# Patient Record
Sex: Female | Born: 1960 | Race: Black or African American | Hispanic: No | Marital: Married | Smoking: Never smoker
Health system: Southern US, Community
[De-identification: ages and names within clinical notes are randomized; demographics above are authoritative.]

## PROBLEM LIST (undated history)

## (undated) DIAGNOSIS — J45909 Unspecified asthma, uncomplicated: Secondary | ICD-10-CM

## (undated) DIAGNOSIS — E785 Hyperlipidemia, unspecified: Secondary | ICD-10-CM

## (undated) DIAGNOSIS — K219 Gastro-esophageal reflux disease without esophagitis: Secondary | ICD-10-CM

## (undated) DIAGNOSIS — I10 Essential (primary) hypertension: Secondary | ICD-10-CM

## (undated) DIAGNOSIS — M1990 Unspecified osteoarthritis, unspecified site: Secondary | ICD-10-CM

## (undated) HISTORY — DX: Unspecified osteoarthritis, unspecified site: M19.90

## (undated) HISTORY — PX: APPENDECTOMY: SHX54

## (undated) HISTORY — DX: Gastro-esophageal reflux disease without esophagitis: K21.9

## (undated) HISTORY — DX: Unspecified asthma, uncomplicated: J45.909

## (undated) HISTORY — DX: Hyperlipidemia, unspecified: E78.5

---

## 2004-10-21 ENCOUNTER — Inpatient Hospital Stay: Payer: Self-pay | Admitting: Internal Medicine

## 2004-10-26 ENCOUNTER — Ambulatory Visit: Payer: Self-pay

## 2009-05-12 ENCOUNTER — Ambulatory Visit: Payer: Self-pay | Admitting: Otolaryngology

## 2011-09-29 ENCOUNTER — Ambulatory Visit: Payer: Self-pay | Admitting: Internal Medicine

## 2011-11-15 ENCOUNTER — Emergency Department: Payer: Self-pay | Admitting: Emergency Medicine

## 2011-11-16 ENCOUNTER — Ambulatory Visit: Payer: Self-pay

## 2012-09-12 ENCOUNTER — Ambulatory Visit: Payer: Self-pay | Admitting: Family Medicine

## 2014-02-25 IMAGING — US US EXTREM LOW VENOUS*L*
2 series · 14 of 24 positions shown · non-contrast
Comparison: none

REASON FOR EXAM: CR [id number] Left Lower Extr Pain Swelling Eval DVT
COMMENTS:

[Series 1: us extrem low venous*left* · 0.09mm/px · 13 of 43 slices shown (1 of 2)]
[im 1/43]
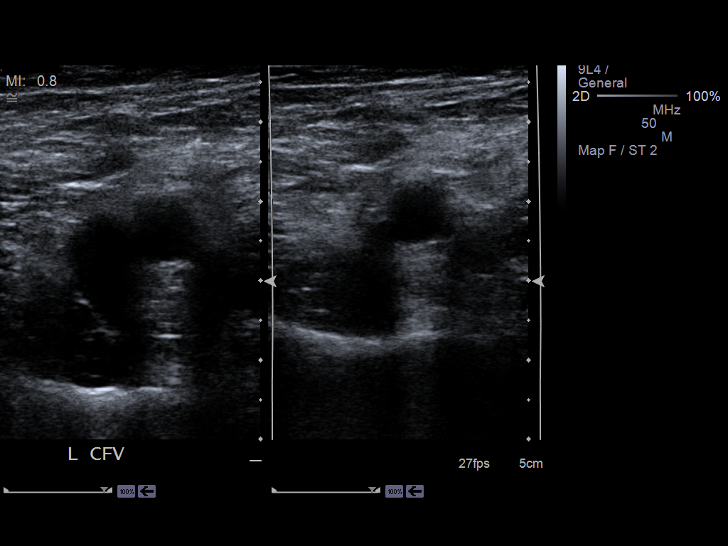
[im 5/43]
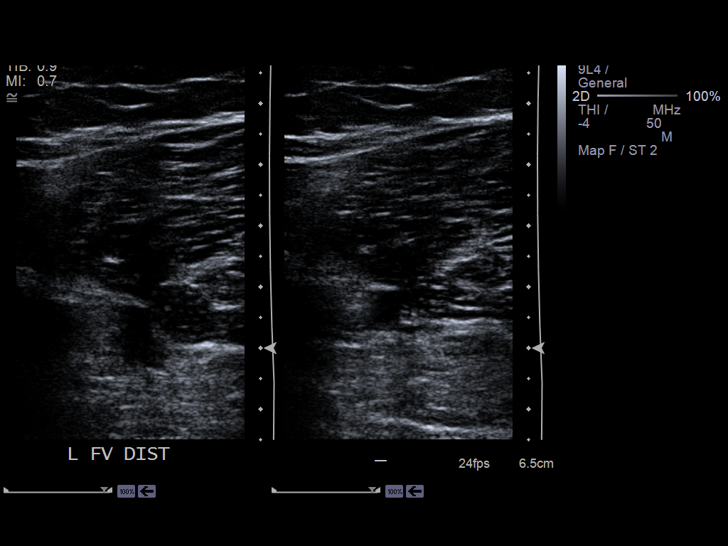
[im 9/43]
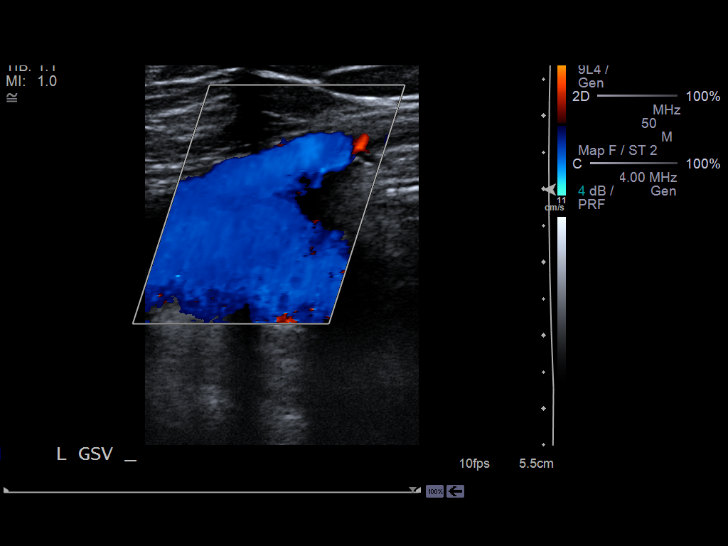
[im 13/43]
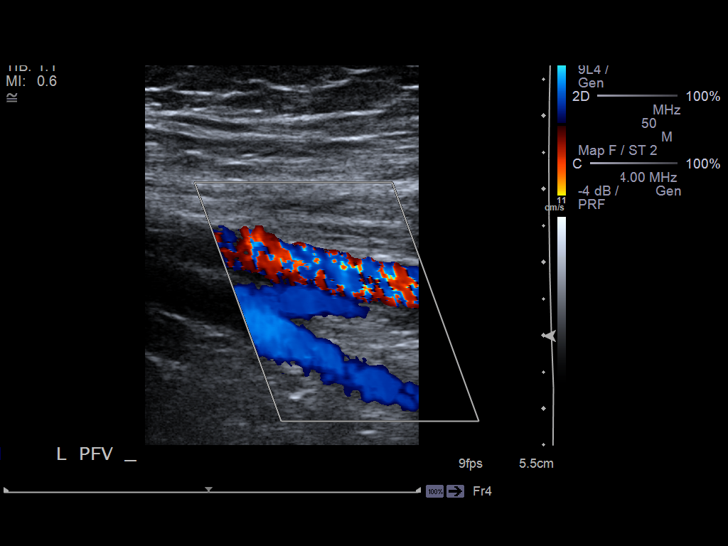
[im 15/43]
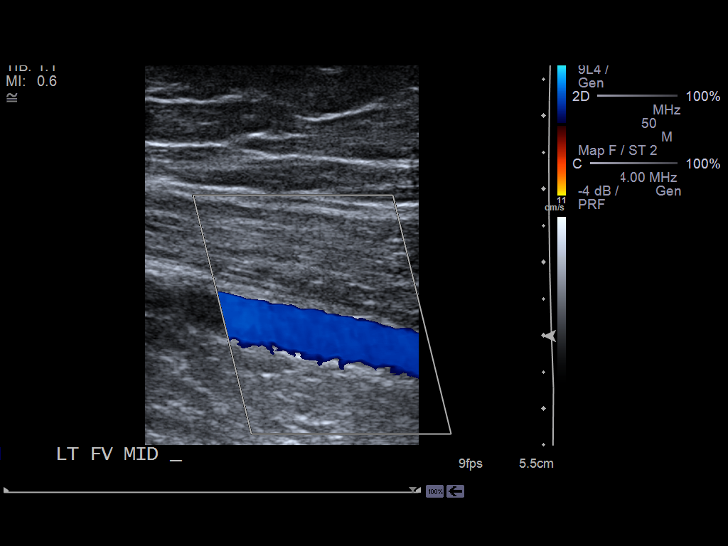
[im 19/43]
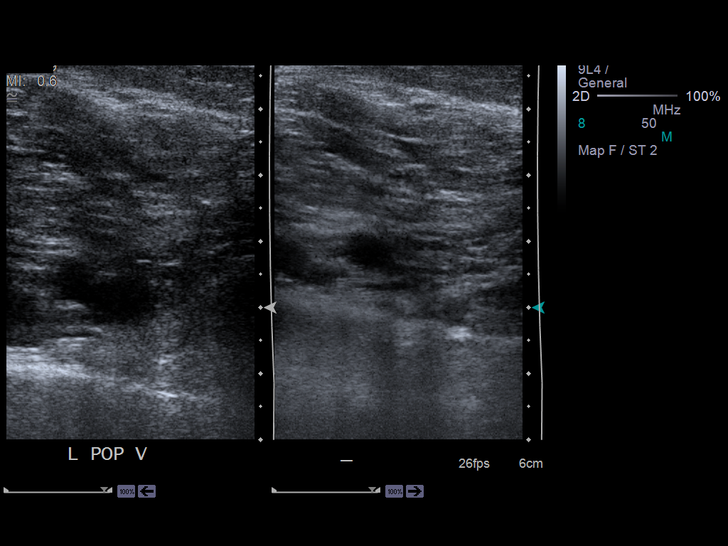
[im 23/43]
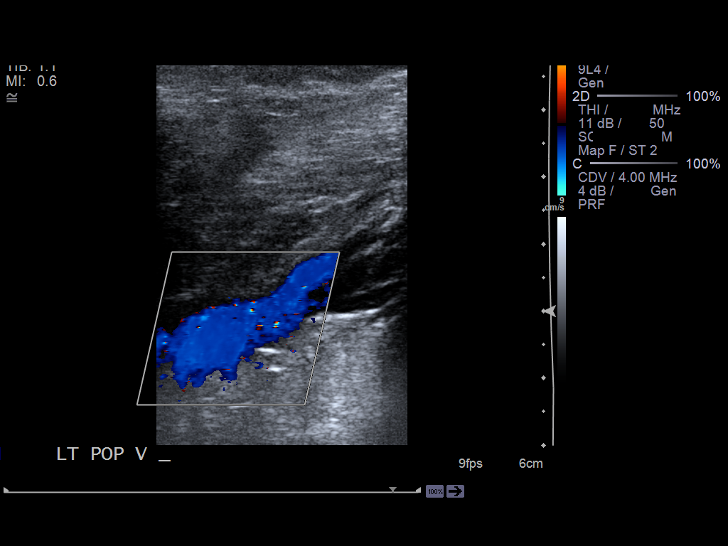
[im 25/43]
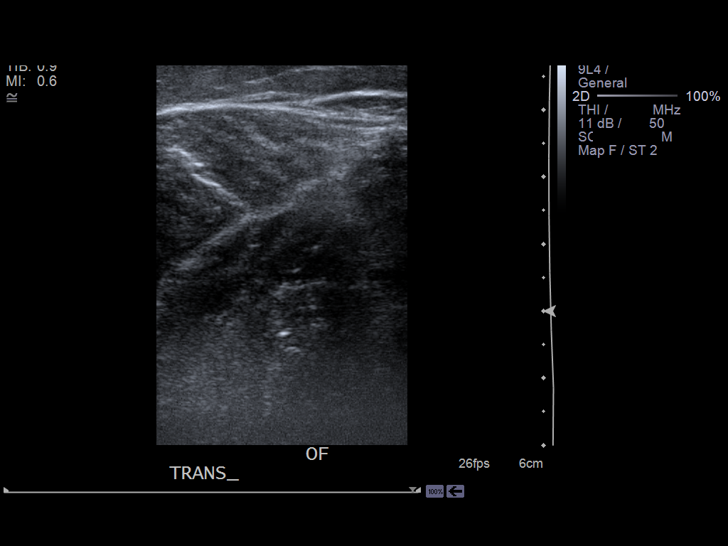
[im 29/43]
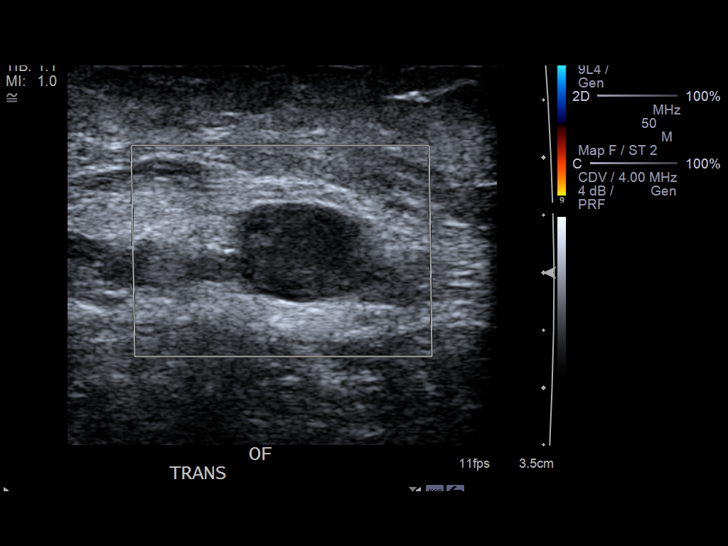
[im 33/43]
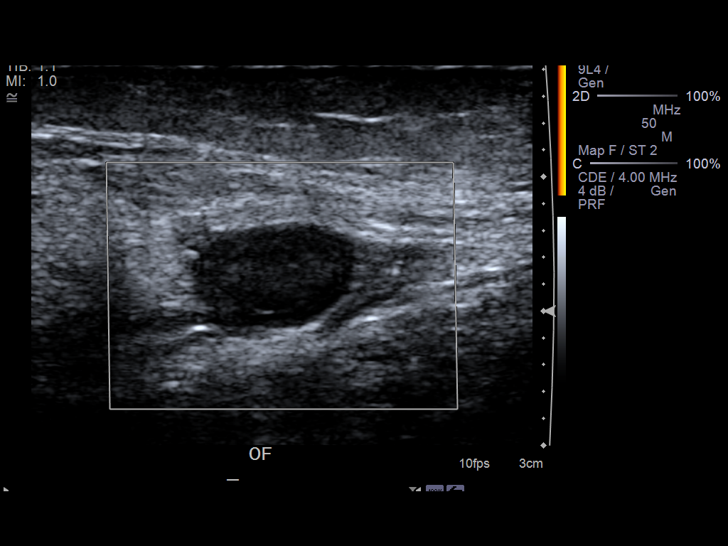
[im 37/43]
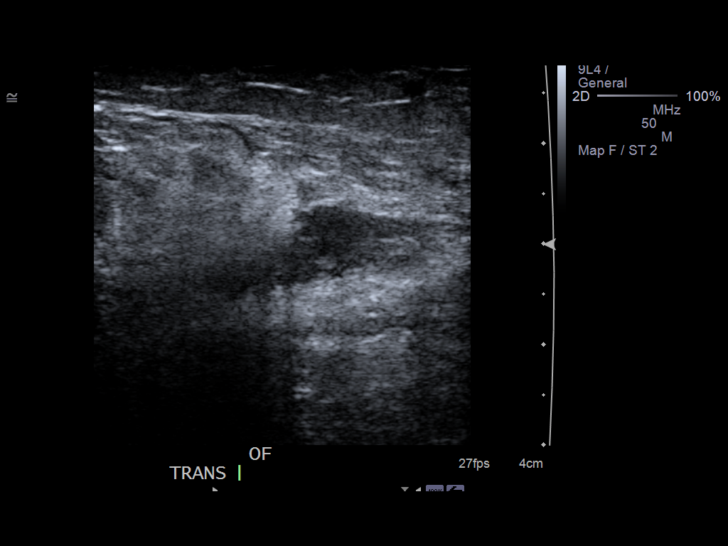
[im 39/43]
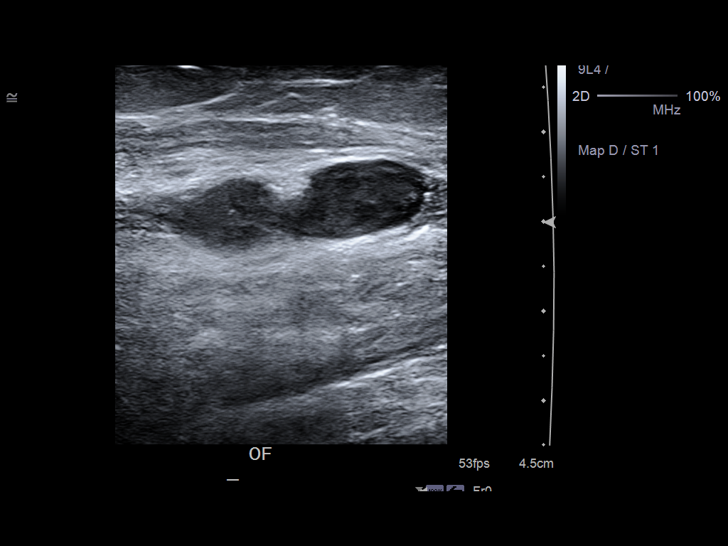
[im 43/43]
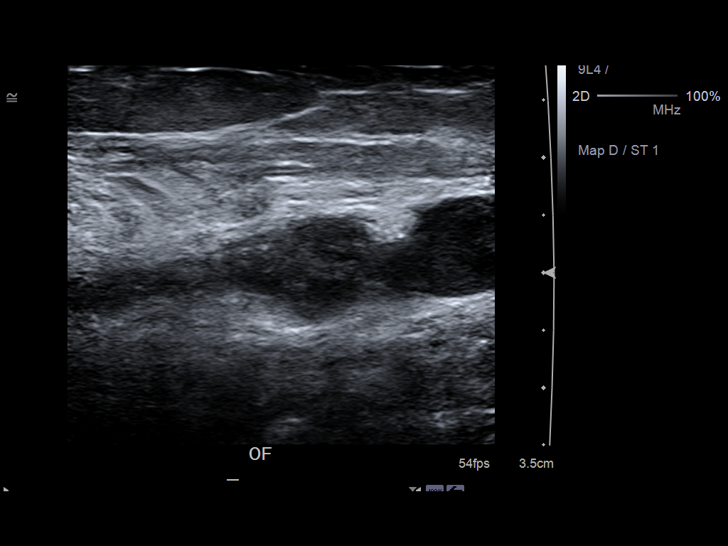

[Series 2: us extrem low venous*left* · 0.14mm/px · 1 of 3 slices shown (2 of 2)]
[im 3/3]
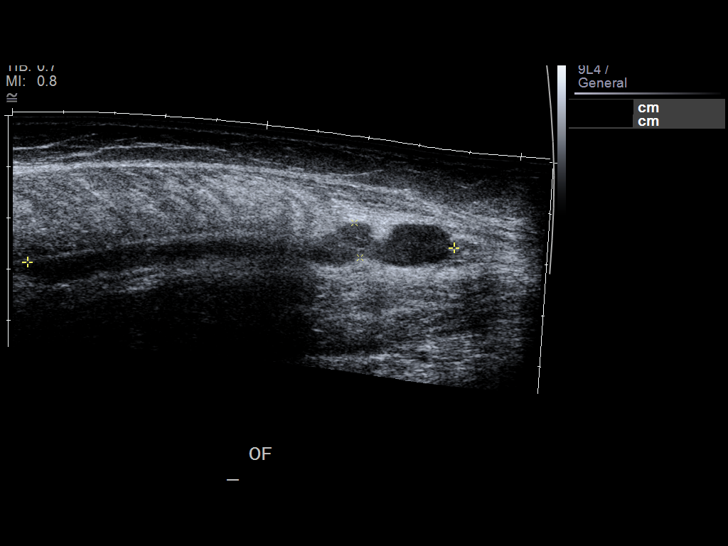

[14 of 24 positions shown; findings below may reference images not displayed]

PROCEDURE:     US  - US DOPPLER LOW EXTR LEFT  - November 16, 2011  [DATE]

RESULT:     Left lower extremity color flow duplex Doppler reveals no
evidence of deep venous thrombosis. A rounded soft tissue density is noted
in the region of the calf. This has internal echoes. Thrombosed varicosity,
solid lesion is a 2, and complex Baker's cyst could present in this fashion.
MRI of the calf may prove useful for further evaluation.
IMPRESSION: Complex rounded solid density measuring 3 cm noted in the
proximal calf region. See differential diagnosis above. MRI should be
considered for further evaluation.

## 2014-12-23 IMAGING — MG MAM DGTL SCRN MAM NO ORDER W/CAD
1 series · 4 of 4 positions shown · non-contrast
Comparison: none

REASON FOR EXAM: SCR MAMMO NO ORDER
COMMENTS:

[R CC · right · 4 of 4 slices shown]
[im 1/4]
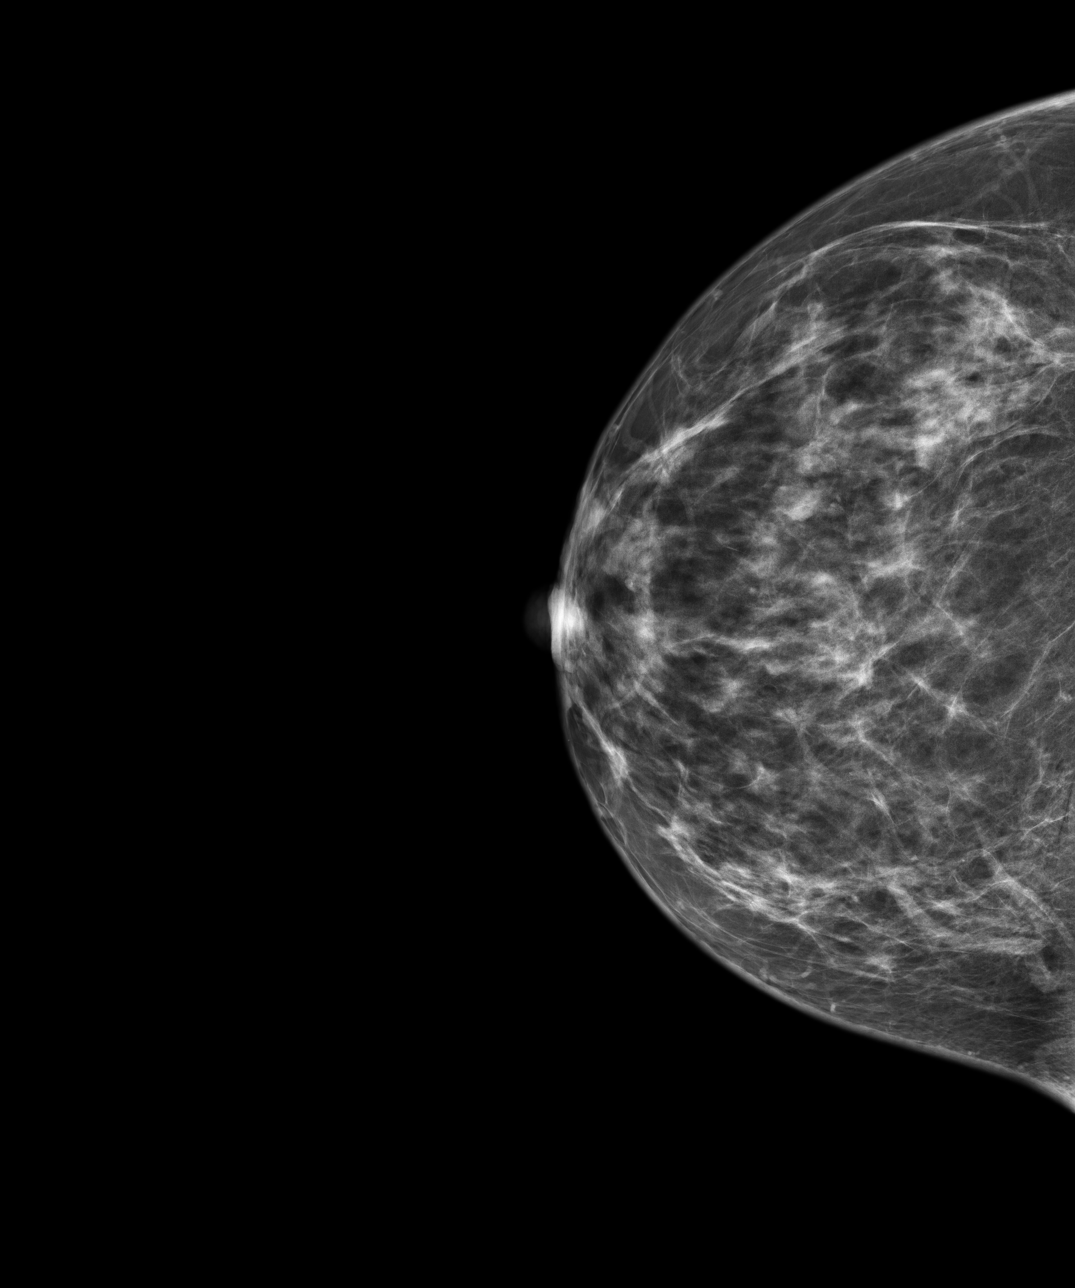
[im 2/4]
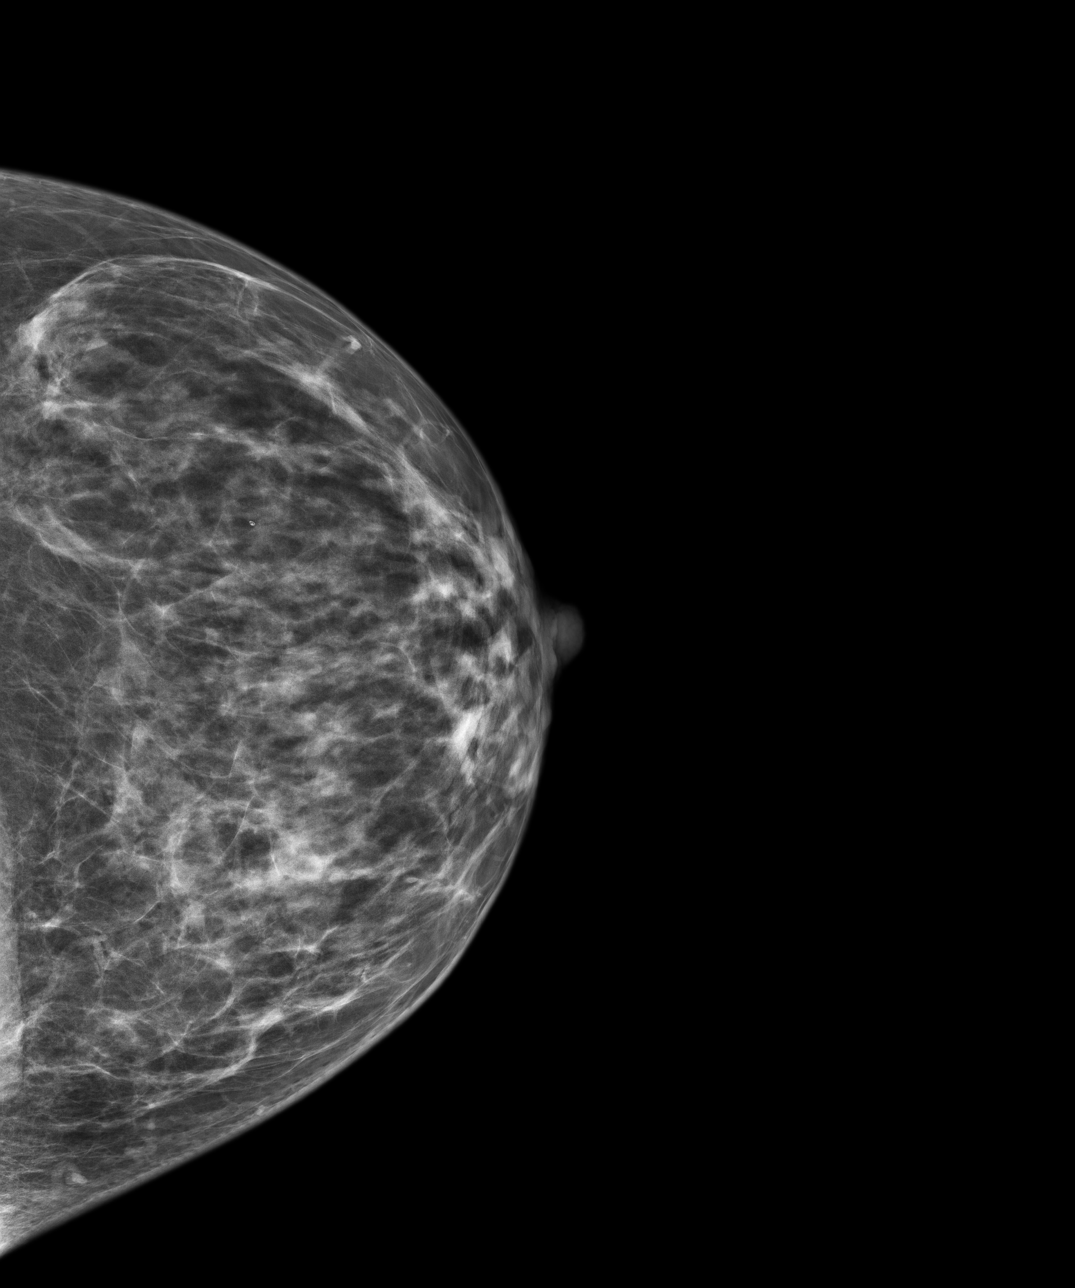
[im 3/4]
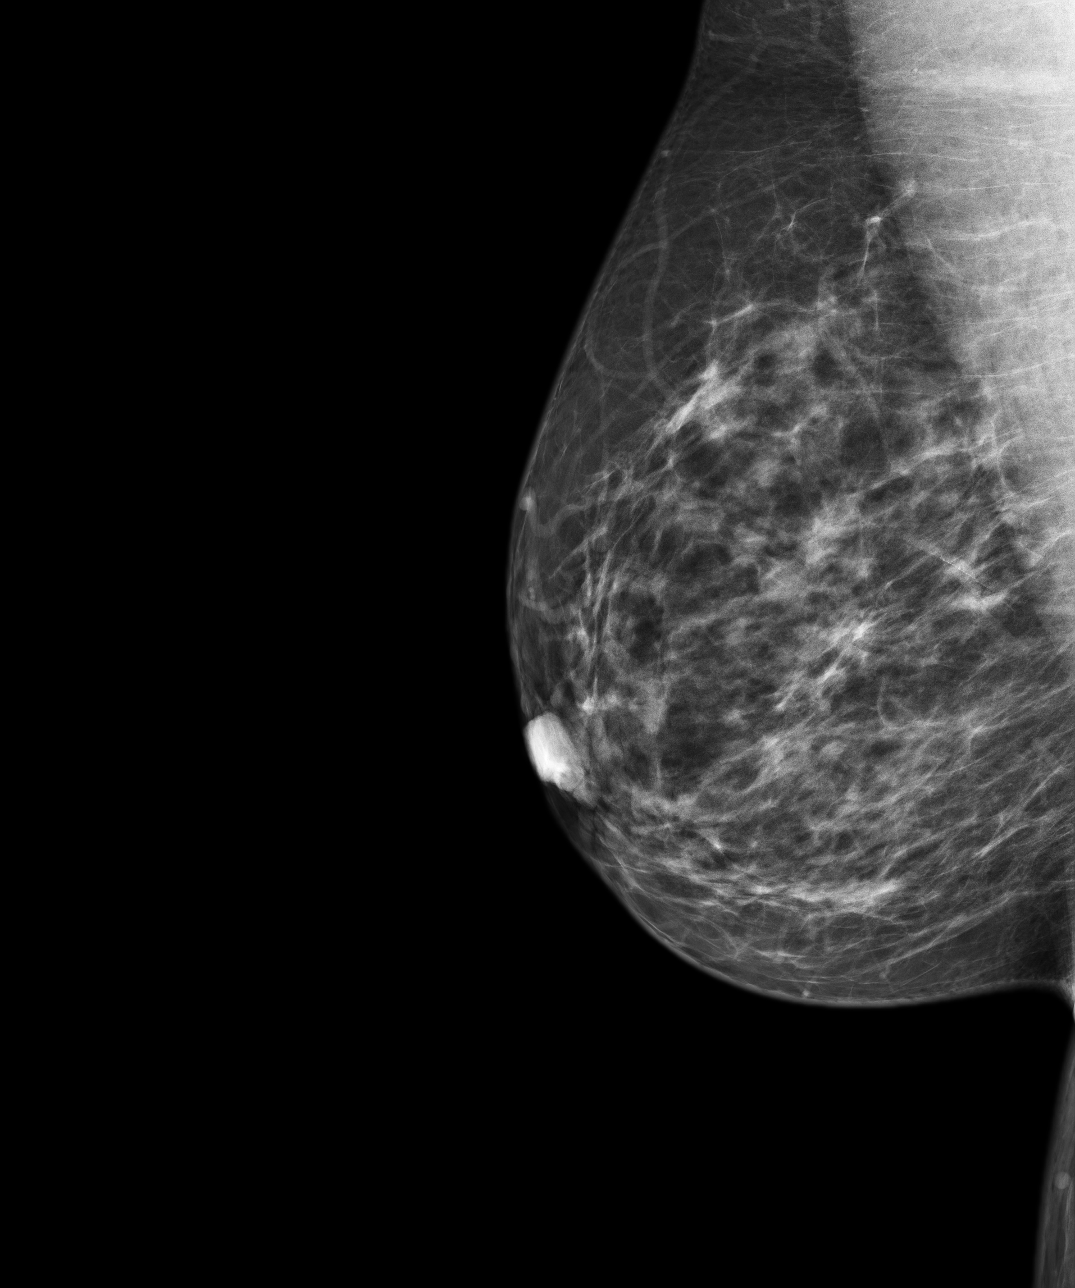
[im 4/4]
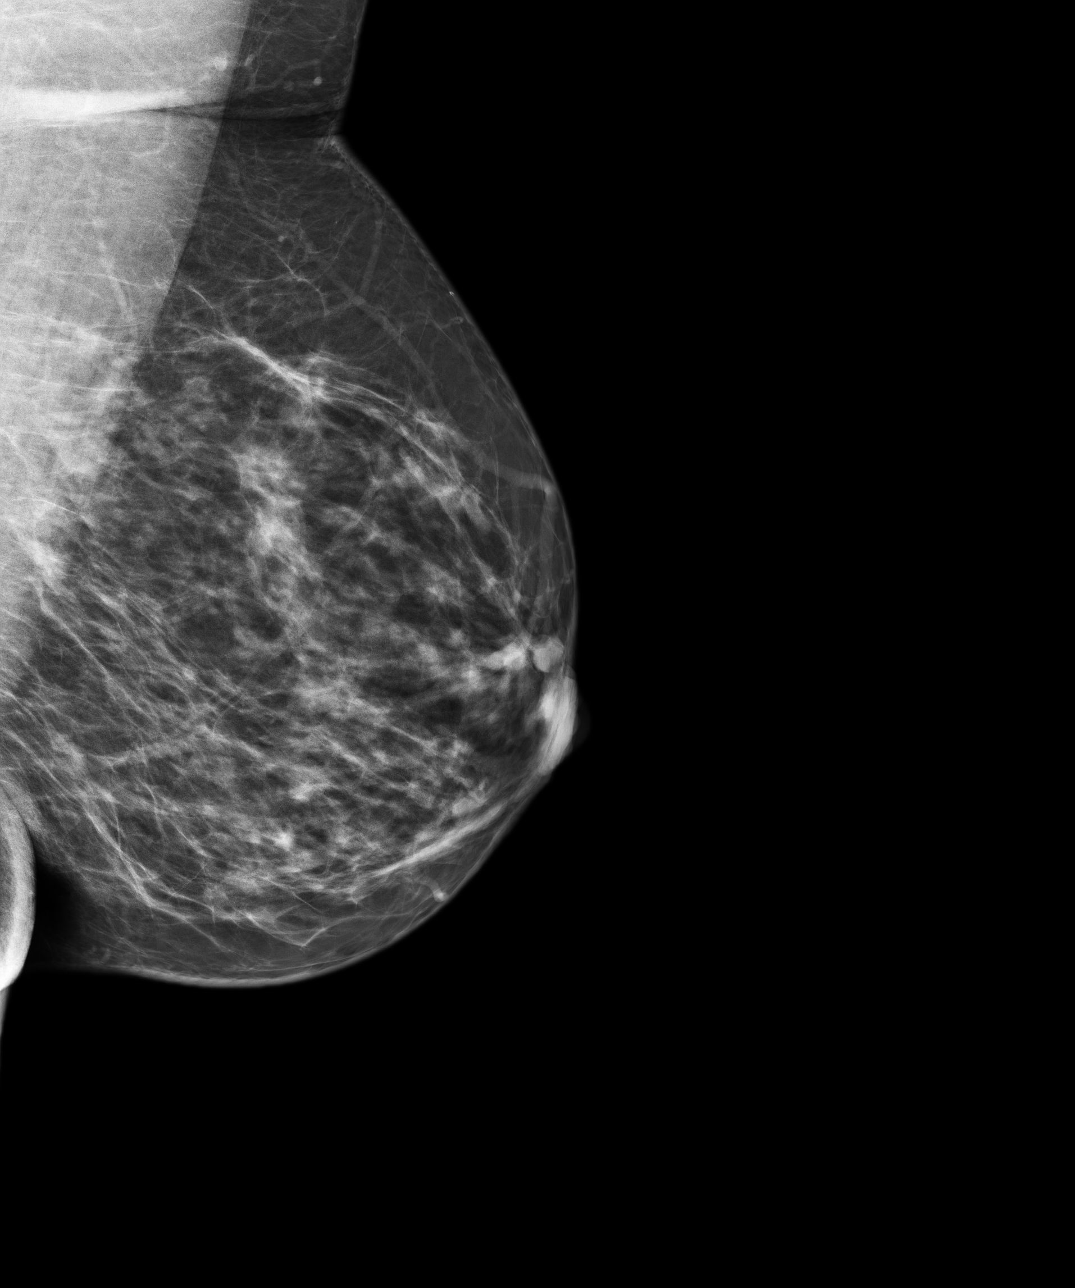

[4 of 4 positions shown; findings below may reference images not displayed]

PROCEDURE:     MAM - MAM DGTL SCRN MAM NO ORDER W/CAD  - September 12, 2012  [DATE]

RESULT:     The patient underwent bilateral screening mammography today.
This study is interpreted as a baseline study given that the previous
studies from 4922 are not available.

The breasts exhibit a scattered fibroglandular pattern. There is no dominant
mass. There are no malignant appearing groupings of microcalcification. No
areas of new architectural distortion are demonstrated.
IMPRESSION: There are no findings suspicious or malignancy.

BI-RADS 1: Negative mammogram.

Recommendation: please began yearly mammographic followup.

BREAST COMPOSITION: The breast composition is SCATTERED FIBROGLANDULAR
TISSUE (glandular tissue is 25-50%)

A NEGATIVE MAMMOGRAM REPORT DOES NOT PRECLUDE BIOPSY OR OTHER EVALUATION OF
A CLINICALLY PALPABLE OR OTHERWISE SUSPICIOUS MASS OR LESION. BREAST CANCER
MAY NOT BE DETECTED BY MAMMOGRAPHY IN UP TO 10% OF CASES.

Dictation site:1

## 2015-02-18 ENCOUNTER — Ambulatory Visit: Payer: Self-pay

## 2015-02-18 ENCOUNTER — Encounter: Payer: Self-pay | Admitting: Podiatry

## 2015-02-18 ENCOUNTER — Ambulatory Visit (INDEPENDENT_AMBULATORY_CARE_PROVIDER_SITE_OTHER): Payer: Managed Care, Other (non HMO) | Admitting: Podiatry

## 2015-02-18 VITALS — BP 154/95 | HR 86 | Resp 16 | Ht 63.0 in | Wt 155.0 lb

## 2015-02-18 DIAGNOSIS — M79673 Pain in unspecified foot: Secondary | ICD-10-CM

## 2015-02-18 DIAGNOSIS — M722 Plantar fascial fibromatosis: Secondary | ICD-10-CM | POA: Diagnosis not present

## 2015-02-18 MED ORDER — DICLOFENAC SODIUM 1 % TD GEL: 2.0000 g | Freq: Four times a day (QID) | TRANSDERMAL | Status: DC

## 2015-02-18 NOTE — Patient Instructions (Signed)
Plantar Fasciitis (Heel Spur Syndrome) with Rehab The plantar fascia is a fibrous, ligament-like, soft-tissue structure that spans the bottom of the foot. Plantar fasciitis is a condition that causes pain in the foot due to inflammation of the tissue. SYMPTOMS   Pain and tenderness on the underneath side of the foot.  Pain that worsens with standing or walking. CAUSES  Plantar fasciitis is caused by irritation and injury to the plantar fascia on the underneath side of the foot. Common mechanisms of injury include:  Direct trauma to bottom of the foot.  Damage to a small nerve that runs under the foot where the main fascia attaches to the heel bone.  Stress placed on the plantar fascia due to bone spurs. RISK INCREASES WITH:   Activities that place stress on the plantar fascia (running, jumping, pivoting, or cutting).  Poor strength and flexibility.  Improperly fitted shoes.  Tight calf muscles.  Flat feet.  Failure to warm-up properly before activity.  Obesity. PREVENTION  Warm up and stretch properly before activity.  Allow for adequate recovery between workouts.  Maintain physical fitness:  Strength, flexibility, and endurance.  Cardiovascular fitness.  Maintain a health body weight.  Avoid stress on the plantar fascia.  Wear properly fitted shoes, including arch supports for individuals who have flat feet. PROGNOSIS  If treated properly, then the symptoms of plantar fasciitis usually resolve without surgery. However, occasionally surgery is necessary. RELATED COMPLICATIONS   Recurrent symptoms that may result in a chronic condition.  Problems of the lower back that are caused by compensating for the injury, such as limping.  Pain or weakness of the foot during push-off following surgery.  Chronic inflammation, scarring, and partial or complete fascia tear, occurring more often from repeated injections. TREATMENT  Treatment initially involves the use of  ice and medication to help reduce pain and inflammation. The use of strengthening and stretching exercises may help reduce pain with activity, especially stretches of the Achilles tendon. These exercises may be performed at home or with a therapist. Your caregiver may recommend that you use heel cups of arch supports to help reduce stress on the plantar fascia. Occasionally, corticosteroid injections are given to reduce inflammation. If symptoms persist for greater than 6 months despite non-surgical (conservative), then surgery may be recommended.  MEDICATION   If pain medication is necessary, then nonsteroidal anti-inflammatory medications, such as aspirin and ibuprofen, or other minor pain relievers, such as acetaminophen, are often recommended.  Do not take pain medication within 7 days before surgery.  Prescription pain relievers may be given if deemed necessary by your caregiver. Use only as directed and only as much as you need.  Corticosteroid injections may be given by your caregiver. These injections should be reserved for the most serious cases, because they may only be given a certain number of times. HEAT AND COLD  Cold treatment (icing) relieves pain and reduces inflammation. Cold treatment should be applied for 10 to 15 minutes every 2 to 3 hours for inflammation and pain and immediately after any activity that aggravates your symptoms. Use ice packs or massage the area with a piece of ice (ice massage).  Heat treatment may be used prior to performing the stretching and strengthening activities prescribed by your caregiver, physical therapist, or athletic trainer. Use a heat pack or soak the injury in warm water. SEEK IMMEDIATE MEDICAL CARE IF:  Treatment seems to offer no benefit, or the condition worsens.  Any medications produce adverse side effects. EXERCISES RANGE   OF MOTION (ROM) AND STRETCHING EXERCISES - Plantar Fasciitis (Heel Spur Syndrome) These exercises may help you  when beginning to rehabilitate your injury. Your symptoms may resolve with or without further involvement from your physician, physical therapist or athletic trainer. While completing these exercises, remember:   Restoring tissue flexibility helps normal motion to return to the joints. This allows healthier, less painful movement and activity.  An effective stretch should be held for at least 30 seconds.  A stretch should never be painful. You should only feel a gentle lengthening or release in the stretched tissue. RANGE OF MOTION - Toe Extension, Flexion  Sit with your right / left leg crossed over your opposite knee.  Grasp your toes and gently pull them back toward the top of your foot. You should feel a stretch on the bottom of your toes and/or foot.  Hold this stretch for __________ seconds.  Now, gently pull your toes toward the bottom of your foot. You should feel a stretch on the top of your toes and or foot.  Hold this stretch for __________ seconds. Repeat __________ times. Complete this stretch __________ times per day.  RANGE OF MOTION - Ankle Dorsiflexion, Active Assisted  Remove shoes and sit on a chair that is preferably not on a carpeted surface.  Place right / left foot under knee. Extend your opposite leg for support.  Keeping your heel down, slide your right / left foot back toward the chair until you feel a stretch at your ankle or calf. If you do not feel a stretch, slide your bottom forward to the edge of the chair, while still keeping your heel down.  Hold this stretch for __________ seconds. Repeat __________ times. Complete this stretch __________ times per day.  STRETCH - Gastroc, Standing  Place hands on wall.  Extend right / left leg, keeping the front knee somewhat bent.  Slightly point your toes inward on your back foot.  Keeping your right / left heel on the floor and your knee straight, shift your weight toward the wall, not allowing your back to  arch.  You should feel a gentle stretch in the right / left calf. Hold this position for __________ seconds. Repeat __________ times. Complete this stretch __________ times per day. STRETCH - Soleus, Standing  Place hands on wall.  Extend right / left leg, keeping the other knee somewhat bent.  Slightly point your toes inward on your back foot.  Keep your right / left heel on the floor, bend your back knee, and slightly shift your weight over the back leg so that you feel a gentle stretch deep in your back calf.  Hold this position for __________ seconds. Repeat __________ times. Complete this stretch __________ times per day. STRETCH - Gastrocsoleus, Standing  Note: This exercise can place a lot of stress on your foot and ankle. Please complete this exercise only if specifically instructed by your caregiver.   Place the ball of your right / left foot on a step, keeping your other foot firmly on the same step.  Hold on to the wall or a rail for balance.  Slowly lift your other foot, allowing your body weight to press your heel down over the edge of the step.  You should feel a stretch in your right / left calf.  Hold this position for __________ seconds.  Repeat this exercise with a slight bend in your right / left knee. Repeat __________ times. Complete this stretch __________ times per day.    STRENGTHENING EXERCISES - Plantar Fasciitis (Heel Spur Syndrome)  These exercises may help you when beginning to rehabilitate your injury. They may resolve your symptoms with or without further involvement from your physician, physical therapist or athletic trainer. While completing these exercises, remember:   Muscles can gain both the endurance and the strength needed for everyday activities through controlled exercises.  Complete these exercises as instructed by your physician, physical therapist or athletic trainer. Progress the resistance and repetitions only as guided. STRENGTH -  Towel Curls  Sit in a chair positioned on a non-carpeted surface.  Place your foot on a towel, keeping your heel on the floor.  Pull the towel toward your heel by only curling your toes. Keep your heel on the floor.  If instructed by your physician, physical therapist or athletic trainer, add ____________________ at the end of the towel. Repeat __________ times. Complete this exercise __________ times per day. STRENGTH - Ankle Inversion  Secure one end of a rubber exercise band/tubing to a fixed object (table, pole). Loop the other end around your foot just before your toes.  Place your fists between your knees. This will focus your strengthening at your ankle.  Slowly, pull your big toe up and in, making sure the band/tubing is positioned to resist the entire motion.  Hold this position for __________ seconds.  Have your muscles resist the band/tubing as it slowly pulls your foot back to the starting position. Repeat __________ times. Complete this exercises __________ times per day.  Document Released: 05/10/2005 Document Revised: 08/02/2011 Document Reviewed: 08/22/2008 [employer] Patient Information 2015 [employer]. This information is not intended to replace advice given to you by your health care provider. Make sure you discuss any questions you have with your health care provider.  

## 2015-02-18 NOTE — Progress Notes (Signed)
   Subjective:    Patient ID: Sydney Martinez, female    DOB: [birth year], 54 y.o.   MRN: [id number]  HPI 54 year old female presents the office with commands of bilateral foot pain of the right side worse than the left which has been ongoing for approximately 3 months. She states that she can feel small and not forming in the bottom of her right foot on the ball of her foot. She has pain in this area with weightbearing. She also states that she has pain in the arch her foot on the left side which is been going on for the same time if not longer than the right foot. She's been taking ibuprofen which does not seem to help. She describes as a throbbing pain. Standing or weightbearing makes the area worse. Denies any recent injury or trauma. No swelling or redness. No tingling or numbness. No other complaints at this time.   Review of Systems  All other systems reviewed and are negative.      Objective:   Physical Exam AAO x3, NAD DP/PT pulses palpable bilaterally, CRT less than 3 seconds Protective sensation intact with Simms Weinstein monofilament, vibratory sensation intact, Achilles tendon reflex intact There is tenderness to palpation along the medial band of the plantar fascia distally proximal to the sesamoid complex. There does appear to be a small firm nodule within the ligament. There is tenderness to palpation over the area. There is no overlying edema, erythema, increase in warmth. There is no area pinpoint bony tenderness or pain the vibratory sensation. On the left foot there is tenderness on the medial arch of the foot plantarly. As appears a more generalized pain. There is no specific area of tenderness to palpation. Again there is no overlying edema, erythema, increase in warmth. There is a mild decrease in medial arch height upon weightbearing bilaterally. Equinus is present. No other areas of tenderness to bilateral lower extremities. MMT 5/5, ROM WNL.  No open lesions or  pre-ulcerative lesions.  No pain with calf compression, swelling, warmth, erythema bilaterally.      Assessment & Plan:  54 year old female with bilateral foot pain with a right greater than left. -Treatment options discussed including all alternatives, risks, and complications -X-rays were obtained and reviewed with the patient.  -Discussed that the pain in the right foot a be a small plantar fibroma. I discussed with her steroid injection however she wishes to hold off on that. Offloading pads were dispensed. Prescribed full tear and gel. Discussed shoe gear modifications and orthotics. Anti-inflammatories as needed. -On the left side she'll likely benefit from the arch support. Discussed that both custom and over-the-counter. She will look at an over-the-counter pair of orthotics. Continue supportive shoe gear. -Follow-up in 4 weeks or sooner if any problems arise. In the meantime, encouraged to call the office with any questions, concerns, change in symptoms.   Ovid Curd, DPM

## 2015-02-19 DIAGNOSIS — M79673 Pain in unspecified foot: Secondary | ICD-10-CM

## 2015-03-20 ENCOUNTER — Ambulatory Visit: Payer: Managed Care, Other (non HMO) | Admitting: Podiatry

## 2016-11-09 DIAGNOSIS — M6530 Trigger finger, unspecified finger: Secondary | ICD-10-CM | POA: Insufficient documentation

## 2017-05-17 ENCOUNTER — Encounter: Payer: Self-pay | Admitting: Emergency Medicine

## 2017-05-17 ENCOUNTER — Emergency Department
Admission: EM | Admit: 2017-05-17 | Discharge: 2017-05-17 | Disposition: A | Discharge status: Home / Self Care | Payer: Self-pay | Attending: Emergency Medicine | Admitting: Emergency Medicine

## 2017-05-17 DIAGNOSIS — J069 Acute upper respiratory infection, unspecified: Secondary | ICD-10-CM | POA: Insufficient documentation

## 2017-05-17 DIAGNOSIS — I10 Essential (primary) hypertension: Secondary | ICD-10-CM | POA: Insufficient documentation

## 2017-05-17 DIAGNOSIS — Z79899 Other long term (current) drug therapy: Secondary | ICD-10-CM | POA: Insufficient documentation

## 2017-05-17 HISTORY — DX: Essential (primary) hypertension: I10

## 2017-05-17 MED ORDER — AZITHROMYCIN 500 MG PO TABS: 500.0000 mg | ORAL_TABLET | Freq: Every day | ORAL | 0 refills | Status: DC

## 2017-05-17 MED ORDER — BENZONATATE 100 MG PO CAPS: 200.0000 mg | ORAL_CAPSULE | Freq: Once | ORAL | Status: DC

## 2017-05-17 MED ORDER — AZITHROMYCIN 500 MG PO TABS: 500.0000 mg | ORAL_TABLET | Freq: Once | ORAL | Status: DC

## 2017-05-17 MED ORDER — AZITHROMYCIN 500 MG PO TABS
ORAL_TABLET | ORAL | Status: AC
  Administered 2017-05-17: 500 mg
  Filled 2017-05-17: qty 1

## 2017-05-17 MED ORDER — BENZONATATE 100 MG PO CAPS
ORAL_CAPSULE | ORAL | Status: AC
  Administered 2017-05-17: 200 mg
  Filled 2017-05-17: qty 2

## 2017-05-17 MED ORDER — BENZONATATE 200 MG PO CAPS
200.0000 mg | ORAL_CAPSULE | Freq: Three times a day (TID) | ORAL | 0 refills | Status: DC | PRN
Start: 2017-05-17 — End: 2018-10-20

## 2017-05-17 NOTE — Discharge Instructions (Signed)
Follow-up with your regular doctor if you are not better in 3-5 days, take the medication as prescribed, he has already been given the loading dose for the Z-Pak, start taking the remainder of the pills tomorrow, use the Tessalon Perles 3 times a day as needed for the cough, if you start to have chest pain or shortness of breath please return to the emergency department, if you are worsening please return to the emergency department

## 2017-05-17 NOTE — ED Notes (Signed)
AAOx3.  Skin warm and dry.  NAD 

## 2017-05-17 NOTE — ED Provider Notes (Signed)
[facility] Emergency Department Provider Note  ____________________________________________   First MD Initiated Contact with Patient 05/17/17 [phone]     (approximate)  I have reviewed the triage vital signs and the nursing notes.   HISTORY  Chief Complaint Cough    HPI Sydney Martinez is a 56 y.o. female complains of cough and congestion for several days, she states she is not had a fever but does have body aches, she denies sore throat, ear pain, chest pain or shortness of breath, she denies vomiting or diarrhea, she did not take her blood pressure medicine this morning  Past Medical History:  Diagnosis Date  . Hypertension     There are no active problems to display for this patient.   Past Surgical History:  Procedure Laterality Date  . APPENDECTOMY      Prior to Admission medications   Medication Sig Start Date End Date Taking? Authorizing Provider  amLODipine (NORVASC) 5 MG tablet  02/15/15   [provider]  azithromycin (ZITHROMAX) 500 MG tablet Take 1 tablet (500 mg total) by mouth daily. 05/17/17   Sherrie MustacheFisher, Roselyn BeringSusan W, PA-C  BENICAR HCT 40-25 MG tablet  02/15/15   [provider]  benzonatate (TESSALON) 200 MG capsule Take 1 capsule (200 mg total) by mouth 3 (three) times daily as needed for cough. 05/17/17   Sherrie MustacheFisher, Roselyn BeringSusan W, PA-C  diclofenac sodium (VOLTAREN) 1 % GEL Apply 2 g topically 4 (four) times daily. Rub into affected area of foot 2 to 4 times daily 02/18/15   Vivi BarrackWagoner, Matthew R, DPM  ibuprofen (ADVIL,MOTRIN) 800 MG tablet  02/11/15   [provider]    Allergies Patient has no known allergies.  No family history on file.  Social History Social History   Tobacco Use  . Smoking status: Never Smoker  . Smokeless tobacco: Never Used  Substance Use Topics  . Alcohol use: No    Alcohol/week: 0.0 oz    Frequency: Never  . Drug use: No    Review of Systems  Constitutional: No fever/chills Eyes:  No visual changes. ENT: No sore throat. Respiratory: Positive cough Genitourinary: Negative for dysuria. Musculoskeletal: Negative for back pain. Skin: Negative for rash.    ____________________________________________   PHYSICAL EXAM:  VITAL SIGNS: ED Triage Vitals [05/17/17 0702]  Enc Vitals Group     BP (!) 176/105     Pulse Rate 85     Resp 16     Temp 98.8 F (37.1 C)     Temp Source Oral     SpO2 97 %     Weight 156 lb (70.8 kg)     Height 5\' 3"  (1.6 m)     Head Circumference      Peak Flow      Pain Score      Pain Loc      Pain Edu?      Excl. in GC?     Constitutional: Alert and oriented. Well appearing and in no acute distress. Eyes: Conjunctivae are normal.  Head: Atraumatic. EARS: TMs are normal Nose: No congestion/rhinnorhea. Mouth/Throat: Mucous membranes are moist.  Throat is normal  cardiovascular: Normal rate, regular rhythm.  Heart sounds are normal Respiratory: Normal respiratory effort.  No retractions, lungs are clear to auscultation, the cough is deep and bronchial, more of a barking sound GU: deferred Musculoskeletal: FROM all extremities, warm and well perfused Neurologic:  Normal speech and language.  Skin:  Skin is warm, dry and intact. No  rash noted. Psychiatric: Mood and affect are normal. Speech and behavior are normal.  ____________________________________________   LABS (all labs ordered are listed, but only abnormal results are displayed)  Labs Reviewed - No data to display ____________________________________________   ____________________________________________  RADIOLOGY    ____________________________________________   PROCEDURES  Procedure(s) performed: No      ____________________________________________   INITIAL IMPRESSION / ASSESSMENT AND PLAN / ED COURSE  Pertinent labs & imaging results that were available during my care of the patient were reviewed by me and considered in my medical decision  making (see chart for details).  Patient is 56 year old female who presents with acute upper respiratory infection, on physical exam she has a deep barking cough, the lungs are clear to auscultation, Zithromax 500 mg p.o. and Tessalon Perles 200 mg p.o. were given in the ED, she was given a prescription for Zithromax 250 mg daily for 4 days, and Tessalon Perles, she is to return to the emergency department if she is worsening, she is to see her regular doctor if she is not better in 3-5 days, she is to take her blood pressure medicine as soon as she gets home, patient states she understands and was discharged in stable condition      ____________________________________________   FINAL CLINICAL IMPRESSION(S) / ED DIAGNOSES  Final diagnoses:  Acute upper respiratory infection      NEW MEDICATIONS STARTED DURING THIS VISIT:  This SmartLink is deprecated. Use AVSMEDLIST instead to display the medication list for a patient.   Note:  This document was prepared using Dragon voice recognition software and may include unintentional dictation errors.    Faythe GheeFisher, Colsen Modi W, PA-C 05/17/17 0720    Arnaldo NatalMalinda, Paul F, MD 05/17/17 [phone]

## 2017-05-17 NOTE — ED Triage Notes (Signed)
Patient presents to ED via POV from home with c/o "cold like symtoms" x 2 days. Patient reports runny nose, cough and generalized body aches. Ambulatory to triage. Even and non labored respirations noted.

## 2017-05-17 NOTE — ED Notes (Signed)
Patient denies pain and is resting comfortably.  

## 2018-10-20 ENCOUNTER — Ambulatory Visit: Payer: Self-pay | Admitting: Podiatry

## 2018-10-20 ENCOUNTER — Other Ambulatory Visit: Payer: Self-pay

## 2018-10-20 ENCOUNTER — Other Ambulatory Visit: Payer: Self-pay | Admitting: Podiatry

## 2018-10-20 ENCOUNTER — Ambulatory Visit (INDEPENDENT_AMBULATORY_CARE_PROVIDER_SITE_OTHER): Payer: Commercial Managed Care - PPO

## 2018-10-20 ENCOUNTER — Ambulatory Visit (INDEPENDENT_AMBULATORY_CARE_PROVIDER_SITE_OTHER): Payer: Commercial Managed Care - PPO | Admitting: Podiatry

## 2018-10-20 ENCOUNTER — Encounter: Payer: Self-pay | Admitting: Podiatry

## 2018-10-20 VITALS — Temp 98.2°F

## 2018-10-20 DIAGNOSIS — M722 Plantar fascial fibromatosis: Secondary | ICD-10-CM

## 2018-10-20 DIAGNOSIS — M79672 Pain in left foot: Secondary | ICD-10-CM | POA: Diagnosis not present

## 2018-10-20 DIAGNOSIS — M79671 Pain in right foot: Secondary | ICD-10-CM

## 2018-10-20 DIAGNOSIS — M779 Enthesopathy, unspecified: Secondary | ICD-10-CM

## 2018-10-20 DIAGNOSIS — M21619 Bunion of unspecified foot: Secondary | ICD-10-CM

## 2018-10-20 MED ORDER — TRIAMCINOLONE ACETONIDE 10 MG/ML IJ SUSP
10.0000 mg | Freq: Once | INTRAMUSCULAR | Status: AC
  Administered 2018-10-20: 10 mg

## 2018-10-20 MED ORDER — DICLOFENAC SODIUM 75 MG PO TBEC: 75.0000 mg | DELAYED_RELEASE_TABLET | Freq: Two times a day (BID) | ORAL | 2 refills | Status: DC

## 2018-10-20 NOTE — Patient Instructions (Signed)

## 2018-10-21 NOTE — Progress Notes (Signed)
Subjective:   Patient ID: Sydney Martinez, female   DOB: 58 y.o.   MRN: [id number]   HPI Patient presents stating she is developed a lot of pain in the bottom of her right heel and arch and it is making it hard for her to be active and on the left foot it hurts in the front part of the foot but it seems to be more after the other problem.  Patient does not smoke and does like to be active   Review of Systems  All other systems reviewed and are negative.       Objective:  Physical Exam Vitals signs and nursing note reviewed.  Constitutional:      Appearance: She is well-developed.  Pulmonary:     Effort: Pulmonary effort is normal.  Musculoskeletal: Normal range of motion.  Skin:    General: Skin is warm.  Neurological:     Mental Status: She is alert.     Neurovascular status intact muscle strength is adequate range of motion was found to be within normal limits.  Patient is found to have exquisite discomfort plantar fascial right at the insertional point tendon into the calcaneus with inflammation fluid and forefoot pain left with inflammation of the second third fourth metatarsal phalangeal joints which appears to be more compensatory.  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  Today I injected the right plantar fascia 3 mg Kenalog 5 mg Xylocaine and I advised on padding therapy for the left and placed on oral anti-inflammatory.  I did dispense fascial brace for the right gave instructions on supportive shoes physical therapy and reappoint to recheck  X-rays were negative for signs of fracture did indicate moderate depression of the arch and no forefoot pathology left

## 2018-10-27 ENCOUNTER — Ambulatory Visit: Payer: Commercial Managed Care - PPO | Admitting: Podiatry

## 2018-11-08 ENCOUNTER — Encounter: Payer: Self-pay | Admitting: Podiatry

## 2018-11-08 ENCOUNTER — Ambulatory Visit: Payer: Commercial Managed Care - PPO | Admitting: Podiatry

## 2018-11-08 ENCOUNTER — Other Ambulatory Visit: Payer: Self-pay

## 2018-11-08 VITALS — Temp 97.6°F

## 2018-11-08 DIAGNOSIS — M722 Plantar fascial fibromatosis: Secondary | ICD-10-CM | POA: Diagnosis not present

## 2018-11-08 NOTE — Progress Notes (Signed)
Subjective:   Patient ID: Sydney Martinez, female   DOB: 58 y.o.   MRN: [id number]   HPI Patient states the right foot is improved and that she is doing stretching exercises and states that the pain is minimal at the current time   ROS      Objective:  Physical Exam  Neurovascular status intact with patient's right heel improved with minimal discomfort upon palpation     Assessment:  Improvement of plantar fasciitis acute in nature right     Plan:  H&P condition reviewed discussed and I recommended continuation of conservative care with consideration long-term for orthotics or other treatment if symptoms recur educated her today signed visit

## 2018-11-16 ENCOUNTER — Emergency Department
Admission: EM | Admit: 2018-11-16 | Discharge: 2018-11-16 | Disposition: A | Discharge status: Home / Self Care | Payer: Commercial Managed Care - PPO | Attending: Emergency Medicine | Admitting: Emergency Medicine

## 2018-11-16 ENCOUNTER — Other Ambulatory Visit: Payer: Self-pay

## 2018-11-16 ENCOUNTER — Encounter: Payer: Self-pay | Admitting: Emergency Medicine

## 2018-11-16 DIAGNOSIS — I10 Essential (primary) hypertension: Secondary | ICD-10-CM | POA: Diagnosis not present

## 2018-11-16 DIAGNOSIS — Z79899 Other long term (current) drug therapy: Secondary | ICD-10-CM | POA: Insufficient documentation

## 2018-11-16 LAB — COMPREHENSIVE METABOLIC PANEL
ALT: 25 U/L (ref 0–44)
AST: 36 U/L (ref 15–41)
Albumin: 4.6 g/dL (ref 3.5–5.0)
Alkaline Phosphatase: 84 U/L (ref 38–126)
Anion gap: 9 (ref 5–15)
BUN: 11 mg/dL (ref 6–20)
CO2: 28 mmol/L (ref 22–32)
Calcium: 9.4 mg/dL (ref 8.9–10.3)
Chloride: 103 mmol/L (ref 98–111)
Creatinine, Ser: 0.75 mg/dL (ref 0.44–1.00)
GFR calc Af Amer: >60 mL/min (ref >60)
GFR calc non Af Amer: >60 mL/min (ref >60)
Glucose, Bld: 98 mg/dL (ref 70–99)
Potassium: 3.4 mmol/L — ABNORMAL LOW (ref 3.5–5.1)
Sodium: 140 mmol/L (ref 135–145)
Total Bilirubin: 0.5 mg/dL (ref 0.3–1.2)
Total Protein: 8.1 g/dL (ref 6.5–8.1)

## 2018-11-16 LAB — CBC
HCT: 40.7 % (ref 36.0–46.0)
Hemoglobin: 13.1 g/dL (ref 12.0–15.0)
MCH: 26.9 pg (ref 26.0–34.0)
MCHC: 32.2 g/dL (ref 30.0–36.0)
MCV: 83.6 fL (ref 80.0–100.0)
Platelets: 286 10*3/uL (ref 150–400)
RBC: 4.87 MIL/uL (ref 3.87–5.11)
RDW: 13.1 % (ref 11.5–15.5)
WBC: 6.3 10*3/uL (ref 4.0–10.5)
nRBC: 0 % (ref 0.0–0.2)

## 2018-11-16 MED ORDER — CLONIDINE HCL 0.1 MG PO TABS
0.2000 mg | ORAL_TABLET | Freq: Once | ORAL | Status: AC
  Administered 2018-11-16: 0.2 mg via ORAL
  Filled 2018-11-16: qty 2

## 2018-11-16 MED ORDER — CLONIDINE HCL 0.1 MG PO TABS: ORAL_TABLET | ORAL | 0 refills | Status: DC

## 2018-11-16 NOTE — ED Provider Notes (Signed)
[facility] Emergency Department Provider Note  Time seen: 9:33 AM  I have reviewed the triage vital signs and the nursing notes.   HISTORY  Chief Complaint Dizziness and Hypertension   HPI Sydney Martinez is a 58 y.o. female with a past medical history of hypertension presents to the emergency department for elevated blood pressure.  According to the patient this morning she was not feeling well which she describes as pressure in her eyes, states it felt like her blood pressure was elevated.  When she got to work she had them check her blood pressure and at that time it was 230/129 and they told the patient to go to the emergency department.  Patient does state this morning she felt somewhat nauseous and "spit up" one time.  Patient denies any fever, shortness of breath or congestion.  Denies any chest pain.  Largely negative review of systems.  Patient states she is feeling better, blood pressure upon arrival 209/109.  Patient states she has had issues with her blood pressure elevating this high in the past as well.  Patient took her normal blood pressure medications this morning.   Past Medical History:  Diagnosis Date  . Hypertension     Patient Active Problem List   Diagnosis Date Noted  . Trigger finger of left hand 11/09/2016    Past Surgical History:  Procedure Laterality Date  . APPENDECTOMY      Prior to Admission medications   Medication Sig Start Date End Date Taking? Authorizing Provider  amLODipine (NORVASC) 10 MG tablet  10/18/18   [provider]  atorvastatin (LIPITOR) 40 MG tablet Take 40 mg by mouth daily. 07/17/18   [provider]  BENICAR HCT 40-25 MG tablet  02/15/15   [provider]  BYSTOLIC 10 MG tablet Take 10 mg by mouth daily. 07/23/18   [provider]  cetirizine (ZYRTEC) 10 MG tablet cetirizine 10 mg tablet  TAKE 1 TABLET BY MOUTH EVERY DAY    [provider]  diclofenac (VOLTAREN)  75 MG EC tablet Take 1 tablet (75 mg total) by mouth 2 (two) times daily. 10/20/18   Lenn Sinkegal, Norman S, DPM  diclofenac sodium (VOLTAREN) 1 % GEL Apply 2 g topically 4 (four) times daily. Rub into affected area of foot 2 to 4 times daily 02/18/15   Vivi BarrackWagoner, Matthew R, DPM  fluticasone [facility](FLONASE) 50 MCG/ACT nasal spray INSTILL 2 SPRAYS INTO EACH NOSTRIL EVERY DAY 07/17/18   [provider]  ibuprofen (ADVIL,MOTRIN) 800 MG tablet  02/11/15   [provider]  pantoprazole (PROTONIX) 40 MG tablet Take 40 mg by mouth daily. 07/17/18   [provider]  potassium chloride (K-DUR) 10 MEQ tablet  05/21/18   [provider]    No Known Allergies  No family history on file.  Social History Social History   Tobacco Use  . Smoking status: Never Smoker  . Smokeless tobacco: Never Used  Substance Use Topics  . Alcohol use: No    Alcohol/week: 0.0 standard drinks    Frequency: Never  . Drug use: No    Review of Systems Constitutional: Negative for fever. ENT: Negative for recent illness/congestion Cardiovascular: Negative for chest pain. Respiratory: Negative for shortness of breath. Gastrointestinal: Negative for abdominal pain.  One episode of vomiting this morning. Musculoskeletal: Negative for musculoskeletal complaints Skin: Negative for skin complaints  Neurological: Negative for headache.  Negative for weakness or numbness. All other ROS negative  ____________________________________________   PHYSICAL  EXAM:  VITAL SIGNS: ED Triage Vitals  Enc Vitals Group     BP 11/16/18 0922 (!) 209/109     Pulse Rate 11/16/18 0922 69     Resp 11/16/18 0922 18     Temp 11/16/18 0922 98.4 F (36.9 C)     Temp Source 11/16/18 0922 Oral     SpO2 11/16/18 0922 97 %     Weight 11/16/18 0923 167 lb (75.8 kg)     Height 11/16/18 0923 5\' 3"  (1.6 m)     Head Circumference --      Peak Flow --      Pain Score 11/16/18 0923 0     Pain Loc --      Pain Edu? --       Excl. in [place]? --    Constitutional: Alert and oriented. Well appearing and in no distress. Eyes: Normal exam ENT      Head: Normocephalic and atraumatic.      Mouth/Throat: Mucous membranes are moist. Cardiovascular: Normal rate, regular rhythm. No murmur Respiratory: Normal respiratory effort without tachypnea nor retractions. Breath sounds are clear  Gastrointestinal: Soft and nontender. No distention. Musculoskeletal: Nontender with normal range of motion in all extremities. Neurologic:  Normal speech and language. No gross focal neurologic deficits  Skin:  Skin is warm, dry and intact.  Psychiatric: Mood and affect are normal.   ____________________________________________    EKG  EKG viewed and interpreted by myself shows a normal sinus rhythm at 75 bpm with a narrow QRS, normal axis, normal intervals, no concerning ST changes.  ____________________________________________   INITIAL IMPRESSION / ASSESSMENT AND PLAN / ED COURSE  Pertinent labs & imaging results that were available during my care of the patient were reviewed by me and considered in my medical decision making (see chart for details).   Patient presents to the emergency department for high blood pressure found to be 230/129 at work this morning.  Largely negative review of systems otherwise, patient denies any complaints at this time.  Blood pressure currently 209/109.  We will check basic labs, dose clonidine and continue to closely monitor in the emergency department.  Patient agreeable to plan of care.  Patient's labs are largely within normal limits.  Blood pressure is 129/73.  Patient is feeling much better.  We will discharge the patient home.  I will write the patient for 8.1 mg clonidine as needed prescription for a blood pressure greater than 180/100.  Patient agreeable to plan of care and will follow-up with her doctor.  Sydney Martinez was evaluated in Emergency Department on 11/16/2018 for the symptoms  described in the history of present illness. She was evaluated in the context of the global COVID-19 pandemic, which necessitated consideration that the patient might be at risk for infection with the SARS-CoV-2 virus that causes COVID-19. Institutional protocols and algorithms that pertain to the evaluation of patients at risk for COVID-19 are in a state of rapid change based on information released by regulatory bodies including the CDC and federal and state organizations. These policies and algorithms were followed during the patient's care in the ED.  ____________________________________________   FINAL CLINICAL IMPRESSION(S) / ED DIAGNOSES  Hypertension   Harvest Dark, MD 11/16/18 1123

## 2018-11-16 NOTE — ED Triage Notes (Signed)
First RN Note: Pt presents to ED via POV with c/o HTN and dizziness. Pt states was at work when she began feeling dizzy, pt states checked her BP at work, first BP 220/120. Pt states still feels "just a little dizzy", states had an episode of vomiting and felt better. Pt states hx of HTN, took her medicine today.

## 2019-01-28 ENCOUNTER — Other Ambulatory Visit: Payer: Self-pay | Admitting: Podiatry

## 2019-11-13 ENCOUNTER — Other Ambulatory Visit: Payer: Self-pay | Admitting: Family Medicine

## 2019-11-13 DIAGNOSIS — Z1231 Encounter for screening mammogram for malignant neoplasm of breast: Secondary | ICD-10-CM

## 2020-11-07 LAB — HM COLONOSCOPY

## 2021-02-04 ENCOUNTER — Encounter: Payer: Self-pay | Admitting: Emergency Medicine

## 2021-02-04 ENCOUNTER — Other Ambulatory Visit: Payer: Self-pay

## 2021-02-04 ENCOUNTER — Emergency Department
Admission: EM | Admit: 2021-02-04 | Discharge: 2021-02-04 | Disposition: A | Discharge status: Home / Self Care | Payer: Commercial Managed Care - PPO | Attending: Emergency Medicine | Admitting: Emergency Medicine

## 2021-02-04 DIAGNOSIS — K625 Hemorrhage of anus and rectum: Secondary | ICD-10-CM | POA: Insufficient documentation

## 2021-02-04 DIAGNOSIS — Z5321 Procedure and treatment not carried out due to patient leaving prior to being seen by health care provider: Secondary | ICD-10-CM | POA: Insufficient documentation

## 2021-02-04 LAB — COMPREHENSIVE METABOLIC PANEL
ALT: 27 U/L (ref 0–44)
AST: 43 U/L — ABNORMAL HIGH (ref 15–41)
Albumin: 4.4 g/dL (ref 3.5–5.0)
Alkaline Phosphatase: 75 U/L (ref 38–126)
Anion gap: 7 (ref 5–15)
BUN: 13 mg/dL (ref 8–23)
CO2: 30 mmol/L (ref 22–32)
Calcium: 9.4 mg/dL (ref 8.9–10.3)
Chloride: 102 mmol/L (ref 98–111)
Creatinine, Ser: 0.92 mg/dL (ref 0.44–1.00)
GFR, Estimated: >60 mL/min (ref >60)
Glucose, Bld: 115 mg/dL — ABNORMAL HIGH (ref 70–99)
Potassium: 3.2 mmol/L — ABNORMAL LOW (ref 3.5–5.1)
Sodium: 139 mmol/L (ref 135–145)
Total Bilirubin: 0.6 mg/dL (ref 0.3–1.2)
Total Protein: 7.8 g/dL (ref 6.5–8.1)

## 2021-02-04 LAB — CBC
HCT: 37.1 % (ref 36.0–46.0)
Hemoglobin: 12.6 g/dL (ref 12.0–15.0)
MCH: 28.1 pg (ref 26.0–34.0)
MCHC: 34 g/dL (ref 30.0–36.0)
MCV: 82.8 fL (ref 80.0–100.0)
Platelets: 262 10*3/uL (ref 150–400)
RBC: 4.48 MIL/uL (ref 3.87–5.11)
RDW: 14 % (ref 11.5–15.5)
WBC: 6.4 10*3/uL (ref 4.0–10.5)
nRBC: 0 % (ref 0.0–0.2)

## 2021-02-04 LAB — TYPE AND SCREEN
ABO/RH(D): O POS
Antibody Screen: NEGATIVE

## 2021-02-04 NOTE — ED Notes (Signed)
E-signature attempted to be obtained due to patient leaving prior to being seen by physician, but e-signature pad unable to work at this time.  Pt verbalized understanding of risks.  Pt states she will come back tonight, but she has to be at work at 15:30 so she must leave.

## 2021-02-04 NOTE — ED Triage Notes (Addendum)
Pt via POV from home. Pt c/o rectal bleeding since last night. Pt stated it was bright red. Pt has a hx of hemorrhoids. Denies NVD. Denies pain. Denies SOB. Pt is A&Ox4 and NAD.

## 2022-03-07 ENCOUNTER — Encounter: Payer: Self-pay | Admitting: Emergency Medicine

## 2022-03-07 ENCOUNTER — Emergency Department
Admission: EM | Admit: 2022-03-07 | Discharge: 2022-03-07 | Disposition: A | Discharge status: Home / Self Care | Payer: Commercial Managed Care - PPO | Attending: Emergency Medicine | Admitting: Emergency Medicine

## 2022-03-07 ENCOUNTER — Other Ambulatory Visit: Payer: Self-pay

## 2022-03-07 DIAGNOSIS — I10 Essential (primary) hypertension: Secondary | ICD-10-CM | POA: Diagnosis not present

## 2022-03-07 DIAGNOSIS — K0889 Other specified disorders of teeth and supporting structures: Secondary | ICD-10-CM | POA: Insufficient documentation

## 2022-03-07 DIAGNOSIS — K029 Dental caries, unspecified: Secondary | ICD-10-CM | POA: Diagnosis not present

## 2022-03-07 DIAGNOSIS — R030 Elevated blood-pressure reading, without diagnosis of hypertension: Secondary | ICD-10-CM

## 2022-03-07 MED ORDER — AMOXICILLIN 875 MG PO TABS: 875.0000 mg | ORAL_TABLET | Freq: Two times a day (BID) | ORAL | 0 refills | Status: DC

## 2022-03-07 NOTE — ED Provider Notes (Signed)
[facility] Provider Note    Event Date/Time   First MD Initiated Contact with Patient 03/07/22 [phone]     (approximate)   History   Dental Pain   HPI  Sydney Martinez is a 61 y.o. female   presents to the ED with complaint of tooth ache that began yesterday.  Patient is unaware of any fever or chills.  Patient has a history of hypertension and states that she did not take her medication this morning.  Patient states she continues to take amlodipine 10 mg daily.      Physical Exam   Triage Vital Signs: ED Triage Vitals  Enc Vitals Group     BP 03/07/22 0754 (!) 192/154     Pulse Rate 03/07/22 0754 (!) 103     Resp 03/07/22 0754 16     Temp 03/07/22 0754 98.6 F (37 C)     Temp src --      SpO2 03/07/22 0754 99 %     Weight 03/07/22 0750 160 lb 15 oz (73 kg)     Height 03/07/22 0750 5\' 3"  (1.6 m)     Head Circumference --      Peak Flow --      Pain Score --      Pain Loc --      Pain Edu? --      Excl. in [place]? --     Most recent vital signs: Vitals:   03/07/22 0754  BP: (!) 192/154  Pulse: (!) 103  Resp: 16  Temp: 98.6 F (37 C)  SpO2: 99%     General: Awake, no distress.  CV:  Good peripheral perfusion.  Resp:  Normal effort.  Abd:  No distention.  Other:  Upper left posterior molar tender to touch with tongue depressor.  No drainage noted.  There are also multiple teeth with large caries that are to the gumline.  Neck is supple without cervical lymphadenopathy.   ED Results / Procedures / Treatments   Labs (all labs ordered are listed, but only abnormal results are displayed) Labs Reviewed - No data to display    PROCEDURES:  Critical Care performed:   Procedures   MEDICATIONS ORDERED IN ED: Medications - No data to display   IMPRESSION / MDM / ASSESSMENT AND PLAN / ED COURSE  I reviewed the triage vital signs and the nursing notes.   Differential diagnosis includes, but is not limited to, dental pain  secondary to caries, dental abscess, hypertension elevated or uncontrolled, medically noncompliant.  61 year old female presents to the ED with complaint of dental pain that began yesterday.  Patient's blood pressure in triage was 192/154 however patient does not complain of any headache, shortness of breath, chest pain, dizziness or pain other than her tooth.  She reports that she continues taking amlodipine 10 mg daily but did not take it this morning.  Patient does have a PCP and is strongly encouraged to call to have them recheck her blood pressure and when she leaves the emergency department to take her medication when she gets home.  A prescription for amoxicillin 875 twice daily for 10 days was sent to her pharmacy.  She will call her dentist tomorrow to make an appointment.  She was advised that she could take Tylenol with this medication if needed.      Patient's presentation is most consistent with acute, uncomplicated illness.  FINAL CLINICAL IMPRESSION(S) / ED DIAGNOSES   Final diagnoses:  Pain  due to dental caries  Elevated blood pressure reading     Rx / DC Orders   ED Discharge Orders          Ordered    amoxicillin (AMOXIL) 875 MG tablet  2 times daily        03/07/22 0825             Note:  This document was prepared using Dragon voice recognition software and may include unintentional dictation errors.   Johnn Hai, PA-C 03/07/22 0902    Rada Hay, MD 03/07/22 [phone]

## 2022-03-07 NOTE — ED Triage Notes (Signed)
Pt reports toothache to left upper jaw since yesterday. Pt states the pain is now radiating down to her neck.

## 2022-03-07 NOTE — Discharge Instructions (Addendum)
Call make an appointment with your dentist.  Begin taking antibiotics as directed until completely finished. When you get home take your blood pressure medication as your blood pressure is elevated in the emergency department.  You also need to call make an appointment with your medical office to have your blood pressure rechecked and if it continues to be elevated changes in your blood pressure medication may need to be made.  You may take Tylenol or ibuprofen if needed for pain.

## 2022-03-10 ENCOUNTER — Ambulatory Visit: Payer: Self-pay

## 2022-09-23 ENCOUNTER — Telehealth: Payer: Self-pay | Admitting: *Deleted

## 2022-09-23 ENCOUNTER — Encounter: Payer: Self-pay | Admitting: Internal Medicine

## 2022-09-23 ENCOUNTER — Ambulatory Visit: Payer: Commercial Managed Care - PPO | Admitting: Internal Medicine

## 2022-09-23 VITALS — BP 198/100 | HR 85 | Temp 97.9°F | Resp 16 | Ht 63.0 in | Wt 169.6 lb

## 2022-09-23 DIAGNOSIS — K219 Gastro-esophageal reflux disease without esophagitis: Secondary | ICD-10-CM | POA: Diagnosis not present

## 2022-09-23 DIAGNOSIS — Z8742 Personal history of other diseases of the female genital tract: Secondary | ICD-10-CM

## 2022-09-23 DIAGNOSIS — Z1231 Encounter for screening mammogram for malignant neoplasm of breast: Secondary | ICD-10-CM

## 2022-09-23 DIAGNOSIS — Z8719 Personal history of other diseases of the digestive system: Secondary | ICD-10-CM | POA: Diagnosis not present

## 2022-09-23 DIAGNOSIS — I10 Essential (primary) hypertension: Secondary | ICD-10-CM | POA: Diagnosis not present

## 2022-09-23 DIAGNOSIS — E7800 Pure hypercholesterolemia, unspecified: Secondary | ICD-10-CM | POA: Diagnosis not present

## 2022-09-23 DIAGNOSIS — K649 Unspecified hemorrhoids: Secondary | ICD-10-CM

## 2022-09-23 DIAGNOSIS — Z124 Encounter for screening for malignant neoplasm of cervix: Secondary | ICD-10-CM

## 2022-09-23 LAB — URINALYSIS, ROUTINE W REFLEX MICROSCOPIC
Bilirubin Urine: NEGATIVE
Hgb urine dipstick: NEGATIVE
Ketones, ur: NEGATIVE
Nitrite: NEGATIVE
Specific Gravity, Urine: 1.02 (ref 1.000–1.030)
Urine Glucose: NEGATIVE
Urobilinogen, UA: 0.2 (ref 0.0–1.0)
pH: 7 (ref 5.0–8.0)

## 2022-09-23 MED ORDER — ATORVASTATIN CALCIUM 40 MG PO TABS: 40.0000 mg | ORAL_TABLET | Freq: Every day | ORAL | 2 refills | Status: DC

## 2022-09-23 MED ORDER — BENICAR HCT 40-25 MG PO TABS: 1.0000 | ORAL_TABLET | Freq: Every day | ORAL | 2 refills | Status: DC

## 2022-09-23 MED ORDER — PANTOPRAZOLE SODIUM 40 MG PO TBEC: 40.0000 mg | DELAYED_RELEASE_TABLET | Freq: Every day | ORAL | 2 refills | Status: DC

## 2022-09-23 MED ORDER — AMLODIPINE BESYLATE 10 MG PO TABS: 10.0000 mg | ORAL_TABLET | Freq: Every day | ORAL | 1 refills | Status: DC

## 2022-09-23 NOTE — Telephone Encounter (Signed)
Called patient and notified patient amlodipine called to pharmacy.

## 2022-09-23 NOTE — Progress Notes (Signed)
Subjective:    Patient ID: Sydney Martinez, female    DOB: [birth year], 62 y.o.   MRN: [id number]  Patient here for  Chief Complaint  Patient presents with   Establish Care    HPI Here to establish care.  Has not seeing anyone recently.  Previously followed by Dr Maryln Gottron.  Was then followed in [place].  Not sure of name of MD.  States has been off all medication for 2-3 months.  States on or off medication, blood pressure remains elevated.  Not sure of last medication she was taking.  States blood pressures run high - when evaluated.  At home - recent check 156/80.  Tries to stay active.  No chest pain reported.  Breathing stable.  Positive acid reflux.  Certain foods aggravate.  Has taken prilosec.  Reports has had cardiac testing.  States has been told - enlarged heart.  Is s/p appendectomy and BTL.  Has noticed some intermittent rectal bleeding.  Last bleeding 4 months ago.  Has had GI evaluation - colonoscopy.  Has a histor of hemorrhoids and has had hemorrhoid surgery - 20 plus years ago.  History of right ovarian cyest - has known for years.  No pain in years.  No vaginal problems.  Due mammogram.  Needs labs.  History of elevated cholesterol.     Past Medical History:  Diagnosis Date   Arthritis    Asthma    GERD (gastroesophageal reflux disease)    Hyperlipidemia    Hypertension    Past Surgical History:  Procedure Laterality Date   APPENDECTOMY     Family History  Problem Relation Age of Onset   Hyperlipidemia Mother    Arthritis Mother    Hyperlipidemia Father    Hyperlipidemia Sister    Social History   Socioeconomic History   Marital status: Married    Spouse name: Not on file   Number of children: Not on file   Years of education: Not on file   Highest education level: Not on file  Occupational History   Not on file  Tobacco Use   Smoking status: Never   Smokeless tobacco: Never  Substance and Sexual Activity   Alcohol use: No    Alcohol/week: 0.0  standard drinks of alcohol   Drug use: No   Sexual activity: Yes  Other Topics Concern   Not on file  Social History Narrative   Not on file   Social Determinants of Health   Financial Resource Strain: Not on file  Food Insecurity: Not on file  Transportation Needs: Not on file  Physical Activity: Not on file  Stress: Not on file  Social Connections: Not on file     Review of Systems  Constitutional:  Negative for appetite change and unexpected weight change.  HENT:  Negative for congestion and sinus pressure.   Respiratory:  Negative for cough, chest tightness and shortness of breath.   Cardiovascular:  Negative for chest pain and palpitations.  Gastrointestinal:  Negative for abdominal pain, diarrhea, nausea and vomiting.       Acid reflux as outlined.   Genitourinary:  Negative for difficulty urinating and dysuria.  Musculoskeletal:  Negative for joint swelling and myalgias.  Skin:  Negative for color change and rash.  Neurological:  Negative for dizziness and headaches.  Psychiatric/Behavioral:  Negative for agitation and dysphoric mood.        Objective:     BP (!) 198/100   Pulse 85   Temp 97.9 F (  36.6 C)   Resp 16   Ht 5\' 3"  (1.6 m)   Wt 169 lb 9.6 oz (76.9 kg)   LMP  (LMP Unknown)   SpO2 97%   BMI 30.04 kg/m  Wt Readings from Last 3 Encounters:  09/23/22 169 lb 9.6 oz (76.9 kg)  03/07/22 160 lb 15 oz (73 kg)  02/04/21 162 lb (73.5 kg)    Physical Exam Vitals reviewed.  Constitutional:      General: She is not in acute distress.    Appearance: Normal appearance.  HENT:     Head: Normocephalic and atraumatic.     Right Ear: External ear normal.     Left Ear: External ear normal.  Eyes:     General: No scleral icterus.       Right eye: No discharge.        Left eye: No discharge.     Conjunctiva/sclera: Conjunctivae normal.  Neck:     Thyroid: No thyromegaly.  Cardiovascular:     Rate and Rhythm: Normal rate and regular rhythm.  Pulmonary:      Effort: No respiratory distress.     Breath sounds: Normal breath sounds. No wheezing.  Abdominal:     General: Bowel sounds are normal.     Palpations: Abdomen is soft.     Tenderness: There is no abdominal tenderness.  Musculoskeletal:        General: No swelling or tenderness.     Cervical back: Neck supple. No tenderness.  Lymphadenopathy:     Cervical: No cervical adenopathy.  Skin:    Findings: No erythema or rash.  Neurological:     Mental Status: She is alert.  Psychiatric:        Mood and Affect: Mood normal.        Behavior: Behavior normal.      Outpatient Encounter Medications as of 09/23/2022  Medication Sig   amLODipine (NORVASC) 10 MG tablet Take 1 tablet (10 mg total) by mouth daily.   atorvastatin (LIPITOR) 40 MG tablet Take 1 tablet (40 mg total) by mouth daily.   BYSTOLIC 10 MG tablet Take 10 mg by mouth daily.   cetirizine (ZYRTEC) 10 MG tablet cetirizine 10 mg tablet  TAKE 1 TABLET BY MOUTH EVERY DAY   cloNIDine (CATAPRES) 0.1 MG tablet Take 1 tablet p.o. daily as needed for blood pressure greater than 180/100   diclofenac sodium (VOLTAREN) 1 % GEL Apply 2 g topically 4 (four) times daily. Rub into affected area of foot 2 to 4 times daily   fluticasone (FLONASE) 50 MCG/ACT nasal spray INSTILL 2 SPRAYS INTO EACH NOSTRIL EVERY DAY   pantoprazole (PROTONIX) 40 MG tablet Take 1 tablet (40 mg total) by mouth daily.   potassium chloride (K-DUR) 10 MEQ tablet    [DISCONTINUED] amLODipine (NORVASC) 10 MG tablet    [DISCONTINUED] amoxicillin (AMOXIL) 875 MG tablet Take 1 tablet (875 mg total) by mouth 2 (two) times daily.   [DISCONTINUED] atorvastatin (LIPITOR) 40 MG tablet Take 40 mg by mouth daily.   [DISCONTINUED] BENICAR HCT 40-25 MG tablet    [DISCONTINUED] BENICAR HCT 40-25 MG tablet Take 1 tablet by mouth daily.   [DISCONTINUED] diclofenac (VOLTAREN) 75 MG EC tablet TAKE 1 TABLET BY MOUTH TWICE A DAY   [DISCONTINUED] ibuprofen (ADVIL,MOTRIN) 800 MG tablet     [DISCONTINUED] pantoprazole (PROTONIX) 40 MG tablet Take 40 mg by mouth daily.   No facility-administered encounter medications on file as of 09/23/2022.     Lab  Results  Component Value Date   WBC 6.6 09/23/2022   HGB 12.5 09/23/2022   HCT 38.3 09/23/2022   PLT 274.0 09/23/2022   GLUCOSE 100 (H) 09/23/2022   CHOL 260 (H) 09/23/2022   TRIG (H) 09/23/2022    502.0 Triglyceride is over 400; calculations on Lipids are invalid.   HDL 30.90 (L) 09/23/2022   LDLDIRECT 175.0 09/23/2022   ALT 24 09/23/2022   AST 34 09/23/2022   NA 141 09/23/2022   K 3.6 09/23/2022   CL 104 09/23/2022   CREATININE 1.00 09/23/2022   BUN 16 09/23/2022   CO2 28 09/23/2022   TSH 1.13 09/23/2022       Assessment & Plan:  Hypertension, unspecified type Assessment & Plan: Blood pressure significantly elevated.  Check outside of here (at home) as outlined.  Not on any medication and am unsure of medication last taking.  Called pharmacy to update list.  Start back on amlodipine 10mg  q day - given unsure of kidney function, etc.  Check labs - including metabolic panel.  Follow pressures closely.  Get her back in soon to reassess.  EKG check today - changes c/w LVH. Obtain records to review previous cardiac w/up.   Orders: -     EKG 12-Lead -     Basic metabolic panel -     Urinalysis, Routine w reflex microscopic  Encounter for screening mammogram for malignant neoplasm of breast -     3D Screening Mammogram, Left and Right; Future  Hypercholesteremia Assessment & Plan: Restart lipitor.  Check lipid panel and liver fucntion tests.  Low cholesterol diet and exercise.   Orders: -     CBC with Differential/Platelet -     Hepatic function panel -     TSH -     Lipid panel -     LDL cholesterol, direct  History of rectal bleeding Assessment & Plan: Last bleeding - 4 months ago.  Has a history of hemorrhoids.  Has seen GI.  Obtain records.  Need last colonoscopy report.  Hold on any further  intervention right now given no bleeding and until can review records.  Follow.    Gastroesophageal reflux disease, unspecified whether esophagitis present Assessment & Plan: Prilosec.  Avoid foods that aggravate and avoid eating late.  Follow.    Hemorrhoids, unspecified hemorrhoid type Assessment & Plan: Has required surgery previously.  No bleeding currently.  No pain.  Follow.    Cervical cancer screening Assessment & Plan: Overdue.  Get her back in soon for pap smear.    History of ovarian cyst Assessment & Plan: States was told had cyst years ago. Obtain records.     Other orders -     Atorvastatin Calcium; Take 1 tablet (40 mg total) by mouth daily.  Dispense: 30 tablet; Refill: 2 -     Pantoprazole Sodium; Take 1 tablet (40 mg total) by mouth daily.  Dispense: 30 tablet; Refill: 2 -     amLODIPine Besylate; Take 1 tablet (10 mg total) by mouth daily.  Dispense: 30 tablet; Refill: 1     Dale [place], MD

## 2022-09-24 LAB — LIPID PANEL
Cholesterol: 260 mg/dL — ABNORMAL HIGH (ref 0–200)
HDL: 30.9 mg/dL — ABNORMAL LOW (ref >39.00)
Total CHOL/HDL Ratio: 8
Triglycerides: 502 mg/dL — ABNORMAL HIGH (ref 0.0–149.0)

## 2022-09-24 LAB — HEPATIC FUNCTION PANEL
ALT: 24 U/L (ref 0–35)
AST: 34 U/L (ref 0–37)
Albumin: 4.2 g/dL (ref 3.5–5.2)
Alkaline Phosphatase: 78 U/L (ref 39–117)
Bilirubin, Direct: 0 mg/dL (ref 0.0–0.3)
Total Bilirubin: 0.3 mg/dL (ref 0.2–1.2)
Total Protein: 7.1 g/dL (ref 6.0–8.3)

## 2022-09-24 LAB — CBC WITH DIFFERENTIAL/PLATELET
Basophils Absolute: 0.1 10*3/uL (ref 0.0–0.1)
Basophils Relative: 1.2 % (ref 0.0–3.0)
Eosinophils Absolute: 0.1 10*3/uL (ref 0.0–0.7)
Eosinophils Relative: 1 % (ref 0.0–5.0)
HCT: 38.3 % (ref 36.0–46.0)
Hemoglobin: 12.5 g/dL (ref 12.0–15.0)
Lymphocytes Relative: 36.5 % (ref 12.0–46.0)
Lymphs Abs: 2.4 10*3/uL (ref 0.7–4.0)
MCHC: 32.7 g/dL (ref 30.0–36.0)
MCV: 82.4 fl (ref 78.0–100.0)
Monocytes Absolute: 0.5 10*3/uL (ref 0.1–1.0)
Monocytes Relative: 6.9 % (ref 3.0–12.0)
Neutro Abs: 3.6 10*3/uL (ref 1.4–7.7)
Neutrophils Relative %: 54.4 % (ref 43.0–77.0)
Platelets: 274 10*3/uL (ref 150.0–400.0)
RBC: 4.65 Mil/uL (ref 3.87–5.11)
RDW: 14.2 % (ref 11.5–15.5)
WBC: 6.6 10*3/uL (ref 4.0–10.5)

## 2022-09-24 LAB — BASIC METABOLIC PANEL
BUN: 16 mg/dL (ref 6–23)
CO2: 28 mEq/L (ref 19–32)
Calcium: 9.8 mg/dL (ref 8.4–10.5)
Chloride: 104 mEq/L (ref 96–112)
Creatinine, Ser: 1 mg/dL (ref 0.40–1.20)
GFR: 60.04 mL/min (ref >60.00)
Glucose, Bld: 100 mg/dL — ABNORMAL HIGH (ref 70–99)
Potassium: 3.6 mEq/L (ref 3.5–5.1)
Sodium: 141 mEq/L (ref 135–145)

## 2022-09-24 LAB — TSH: TSH: 1.13 u[IU]/mL (ref 0.35–5.50)

## 2022-09-24 LAB — LDL CHOLESTEROL, DIRECT: Direct LDL: 175 mg/dL

## 2022-09-26 ENCOUNTER — Encounter: Payer: Self-pay | Admitting: Internal Medicine

## 2022-09-26 DIAGNOSIS — Z124 Encounter for screening for malignant neoplasm of cervix: Secondary | ICD-10-CM | POA: Insufficient documentation

## 2022-09-26 DIAGNOSIS — Z8742 Personal history of other diseases of the female genital tract: Secondary | ICD-10-CM | POA: Insufficient documentation

## 2022-09-26 DIAGNOSIS — K649 Unspecified hemorrhoids: Secondary | ICD-10-CM | POA: Insufficient documentation

## 2022-09-26 NOTE — Assessment & Plan Note (Signed)
Last bleeding - 4 months ago.  Has a history of hemorrhoids.  Has seen GI.  Obtain records.  Need last colonoscopy report.  Hold on any further intervention right now given no bleeding and until can review records.  Follow.

## 2022-09-26 NOTE — Assessment & Plan Note (Signed)
Has required surgery previously.  No bleeding currently.  No pain.  Follow.

## 2022-09-26 NOTE — Assessment & Plan Note (Signed)
States was told had cyst years ago. Obtain records.

## 2022-09-26 NOTE — Assessment & Plan Note (Signed)
Prilosec.  Avoid foods that aggravate and avoid eating late.  Follow.

## 2022-09-26 NOTE — Assessment & Plan Note (Addendum)
Blood pressure significantly elevated.  Check outside of here (at home) as outlined.  Not on any medication and am unsure of medication last taking.  Called pharmacy to update list.  Start back on amlodipine 10mg  q day - given unsure of kidney function, etc.  Check labs - including metabolic panel.  Follow pressures closely.  Get her back in soon to reassess.  EKG check today - changes c/w LVH. Obtain records to review previous cardiac w/up.

## 2022-09-26 NOTE — Assessment & Plan Note (Signed)
Restart lipitor.  Check lipid panel and liver fucntion tests.  Low cholesterol diet and exercise.

## 2022-09-26 NOTE — Assessment & Plan Note (Signed)
Overdue.  Get her back in soon for pap smear.

## 2022-09-29 ENCOUNTER — Encounter: Payer: Self-pay | Admitting: Internal Medicine

## 2022-09-29 DIAGNOSIS — Z1211 Encounter for screening for malignant neoplasm of colon: Secondary | ICD-10-CM | POA: Insufficient documentation

## 2022-10-16 ENCOUNTER — Other Ambulatory Visit: Payer: Self-pay | Admitting: Internal Medicine

## 2022-10-19 ENCOUNTER — Ambulatory Visit: Payer: Commercial Managed Care - PPO | Admitting: Internal Medicine

## 2022-10-19 ENCOUNTER — Encounter: Payer: Self-pay | Admitting: Internal Medicine

## 2022-10-19 VITALS — BP 158/90 | HR 89 | Temp 97.9°F | Resp 16 | Ht 63.0 in | Wt 171.2 lb

## 2022-10-19 DIAGNOSIS — Z1231 Encounter for screening mammogram for malignant neoplasm of breast: Secondary | ICD-10-CM

## 2022-10-19 DIAGNOSIS — Z8742 Personal history of other diseases of the female genital tract: Secondary | ICD-10-CM | POA: Diagnosis not present

## 2022-10-19 DIAGNOSIS — K219 Gastro-esophageal reflux disease without esophagitis: Secondary | ICD-10-CM | POA: Diagnosis not present

## 2022-10-19 DIAGNOSIS — Z1211 Encounter for screening for malignant neoplasm of colon: Secondary | ICD-10-CM

## 2022-10-19 DIAGNOSIS — I10 Essential (primary) hypertension: Secondary | ICD-10-CM

## 2022-10-19 DIAGNOSIS — M25562 Pain in left knee: Secondary | ICD-10-CM

## 2022-10-19 DIAGNOSIS — E7800 Pure hypercholesterolemia, unspecified: Secondary | ICD-10-CM

## 2022-10-19 DIAGNOSIS — Z124 Encounter for screening for malignant neoplasm of cervix: Secondary | ICD-10-CM

## 2022-10-19 MED ORDER — TELMISARTAN 20 MG PO TABS: 20.0000 mg | ORAL_TABLET | Freq: Every day | ORAL | 1 refills | Status: DC

## 2022-10-19 NOTE — Progress Notes (Signed)
Subjective:    Patient ID: Sydney Martinez, female    DOB: 04/25/1961, 62 y.o.   MRN: [id number]  Patient here for  Chief Complaint  Patient presents with   Medical Management of Chronic Issues    HPI Here for a scheduled follow up.  Was seen recently - new patient.  Blood pressure elevated.  Started back on amlodipine 10mg  q day.  Blood pressure is still elevated, but is doing better overall.  She is tolerating.  No chest pain or sob reported.  Had eyes checked last week.  No nausea or vomiting.  Saw Jacob Moores 08/19/20 - recommended EGD and colonoscopy. Colonoscopy 10/2020 - one 4 mm polyp in the descending colon and diverticulosis.  Also internal hemorrhoids and decreased sphincter tone.  EGD - mild schatzki ring and reflux esophagitis.  Does report some persistent intermittent pelvic pain.  No urinary symptoms reported.  Is having persistent problems with her left knee.  Request referral to Dr Thurston Hole.    Past Medical History:  Diagnosis Date   Arthritis    Asthma    GERD (gastroesophageal reflux disease)    Hyperlipidemia    Hypertension    Past Surgical History:  Procedure Laterality Date   APPENDECTOMY     Family History  Problem Relation Age of Onset   Hyperlipidemia Mother    Arthritis Mother    Hyperlipidemia Father    Hyperlipidemia Sister    Social History   Socioeconomic History   Marital status: Married    Spouse name: Not on file   Number of children: Not on file   Years of education: Not on file   Highest education level: Not on file  Occupational History   Not on file  Tobacco Use   Smoking status: Never   Smokeless tobacco: Never  Substance and Sexual Activity   Alcohol use: No    Alcohol/week: 0.0 standard drinks of alcohol   Drug use: No   Sexual activity: Yes  Other Topics Concern   Not on file  Social History Narrative   Not on file   Social Determinants of Health   Financial Resource Strain: Not on file  Food Insecurity: Not on file   Transportation Needs: Not on file  Physical Activity: Not on file  Stress: Not on file  Social Connections: Not on file     Review of Systems  Constitutional:  Negative for appetite change and unexpected weight change.  HENT:  Negative for congestion and sinus pressure.   Respiratory:  Negative for cough, chest tightness and shortness of breath.   Cardiovascular:  Negative for chest pain, palpitations and leg swelling.  Gastrointestinal:  Negative for abdominal pain, diarrhea, nausea and vomiting.       Intermittent pelvic pain as outlined.   Genitourinary:  Negative for difficulty urinating and dysuria.  Musculoskeletal:  Negative for myalgias.       Left knee pain as outlined.   Skin:  Negative for color change and rash.  Neurological:  Negative for dizziness and headaches.  Psychiatric/Behavioral:  Negative for agitation and dysphoric mood.        Objective:     BP (!) 158/90   Pulse 89   Temp 97.9 F (36.6 C)   Resp 16   Ht 5\' 3"  (1.6 m)   Wt 171 lb 3.2 oz (77.7 kg)   LMP  (LMP Unknown)   SpO2 98%   BMI 30.33 kg/m  Wt Readings from Last 3 Encounters:  10/19/22  171 lb 3.2 oz (77.7 kg)  09/23/22 169 lb 9.6 oz (76.9 kg)  03/07/22 160 lb 15 oz (73 kg)    Physical Exam Vitals reviewed.  Constitutional:      General: She is not in acute distress.    Appearance: Normal appearance.  HENT:     Head: Normocephalic and atraumatic.     Right Ear: External ear normal.     Left Ear: External ear normal.  Eyes:     General: No scleral icterus.       Right eye: No discharge.        Left eye: No discharge.     Conjunctiva/sclera: Conjunctivae normal.  Neck:     Thyroid: No thyromegaly.  Cardiovascular:     Rate and Rhythm: Normal rate and regular rhythm.  Pulmonary:     Effort: No respiratory distress.     Breath sounds: Normal breath sounds. No wheezing.  Abdominal:     General: Bowel sounds are normal.     Palpations: Abdomen is soft.     Comments: Lower  abdominal/pelvic pain   Musculoskeletal:        General: No swelling or tenderness.     Cervical back: Neck supple. No tenderness.  Lymphadenopathy:     Cervical: No cervical adenopathy.  Skin:    Findings: No erythema or rash.  Neurological:     Mental Status: She is alert.  Psychiatric:        Mood and Affect: Mood normal.        Behavior: Behavior normal.      Outpatient Encounter Medications as of 10/19/2022  Medication Sig   amLODipine (NORVASC) 10 MG tablet TAKE 1 TABLET BY MOUTH EVERY DAY   atorvastatin (LIPITOR) 40 MG tablet Take 1 tablet (40 mg total) by mouth daily.   meloxicam (MOBIC) 15 MG tablet Take 15 mg by mouth daily.   telmisartan (MICARDIS) 20 MG tablet Take 1 tablet (20 mg total) by mouth daily.   cetirizine (ZYRTEC) 10 MG tablet cetirizine 10 mg tablet  TAKE 1 TABLET BY MOUTH EVERY DAY   fluticasone (FLONASE) 50 MCG/ACT nasal spray INSTILL 2 SPRAYS INTO EACH NOSTRIL EVERY DAY   pantoprazole (PROTONIX) 40 MG tablet Take 1 tablet (40 mg total) by mouth daily.   [DISCONTINUED] amLODipine (NORVASC) 10 MG tablet Take 1 tablet (10 mg total) by mouth daily.   [DISCONTINUED] BYSTOLIC 10 MG tablet Take 10 mg by mouth daily.   [DISCONTINUED] cloNIDine (CATAPRES) 0.1 MG tablet Take 1 tablet p.o. daily as needed for blood pressure greater than 180/100   [DISCONTINUED] diclofenac sodium (VOLTAREN) 1 % GEL Apply 2 g topically 4 (four) times daily. Rub into affected area of foot 2 to 4 times daily   [DISCONTINUED] potassium chloride (K-DUR) 10 MEQ tablet    No facility-administered encounter medications on file as of 10/19/2022.     Lab Results  Component Value Date   WBC 6.6 09/23/2022   HGB 12.5 09/23/2022   HCT 38.3 09/23/2022   PLT 274.0 09/23/2022   GLUCOSE 100 (H) 09/23/2022   CHOL 260 (H) 09/23/2022   TRIG (H) 09/23/2022    502.0 Triglyceride is over 400; calculations on Lipids are invalid.   HDL 30.90 (L) 09/23/2022   LDLDIRECT 175.0 09/23/2022   ALT 24  09/23/2022   AST 34 09/23/2022   NA 141 09/23/2022   K 3.6 09/23/2022   CL 104 09/23/2022   CREATININE 1.00 09/23/2022   BUN 16 09/23/2022  CO2 28 09/23/2022   TSH 1.13 09/23/2022    No results found.     Assessment & Plan:  Hypertension, unspecified type Assessment & Plan: Blood pressure better.  Still elevated.  Continue amlodipine.  Add micardis 20mg  q day.  Follow pressures.  Check metabolic panel in 10-14 days.   Orders: -     Basic metabolic panel; Future  History of ovarian cyst Assessment & Plan: States was told had cyst years ago. Given intermittent pain and history, will obtain pelvic ultrasound to further evaluate.   Orders: -     US PELVIC COMPLETE WITH TRANSVAGINAL; Future  Encounter for screening mammogram for malignant neoplasm of breast -     3D Screening Mammogram, Left and Right; Future  Left knee pain, unspecified chronicity Assessment & Plan: Persistent knee pain.  Request referral to Dr Salvatore Marvel - [facility]).   Orders: -     Ambulatory referral to Orthopedic Surgery  Cervical cancer screening Assessment & Plan: Overdue.  Get her back in soon for pap smear.    Colon cancer screening Assessment & Plan: Colonoscopy 10/2020 - one 4mm polyp in descending colon.  Internal hemorrhoids.  Diverticulosis.  Pathology - polypoid colonic mucosa - negative.    Gastroesophageal reflux disease, unspecified whether esophagitis present Assessment & Plan: Continues PPI.  Follow.  EGD as outlined - 10/2020.     Hypercholesteremia Assessment & Plan: Back on llipitor.  Check lipid panel and liver fucntion tests.  Low cholesterol diet and exercise.    Other orders -     Telmisartan; Take 1 tablet (20 mg total) by mouth daily.  Dispense: 30 tablet; Refill: 1     Dale [place], MD

## 2022-10-24 ENCOUNTER — Encounter: Payer: Self-pay | Admitting: Internal Medicine

## 2022-10-24 NOTE — Assessment & Plan Note (Signed)
Continues PPI.  Follow.  EGD as outlined - 10/2020.

## 2022-10-24 NOTE — Assessment & Plan Note (Signed)
Persistent knee pain.  Request referral to Dr Salvatore Marvel - [facility]).

## 2022-10-24 NOTE — Assessment & Plan Note (Signed)
Overdue.  Get her back in soon for pap smear.  

## 2022-10-24 NOTE — Assessment & Plan Note (Signed)
Colonoscopy 10/2020 - one 4mm polyp in descending colon.  Internal hemorrhoids.  Diverticulosis.  Pathology - polypoid colonic mucosa - negative.

## 2022-10-24 NOTE — Assessment & Plan Note (Signed)
Back on llipitor.  Check lipid panel and liver fucntion tests.  Low cholesterol diet and exercise.

## 2022-10-24 NOTE — Assessment & Plan Note (Signed)
Blood pressure better.  Still elevated.  Continue amlodipine.  Add micardis 20mg  q day.  Follow pressures.  Check metabolic panel in 10-14 days.

## 2022-10-24 NOTE — Assessment & Plan Note (Signed)
States was told had cyst years ago. Given intermittent pain and history, will obtain pelvic ultrasound to further evaluate.

## 2022-10-27 ENCOUNTER — Other Ambulatory Visit: Payer: Self-pay | Admitting: Internal Medicine

## 2022-10-28 ENCOUNTER — Telehealth: Payer: Self-pay

## 2022-10-28 NOTE — Telephone Encounter (Signed)
Patient states she has broken out in a rash and she is itching.  Patient states the rash is on her chest, face, arms, and she is itching on her legs.  Patient states this started in a small way last week, but it has gotten worse.  Patient states she believes it is one of her blood pressure medications and she states it did list itchiness as a possible side effect.

## 2022-10-28 NOTE — Telephone Encounter (Signed)
Reviewed.  Given increased symptoms and not sure if benadryl helping, recommend evaluation today.  Stop micardis.  Follow closely.

## 2022-10-28 NOTE — Telephone Encounter (Signed)
Pt returned Trisha LPN call. Unable to transferred. 

## 2022-10-28 NOTE — Telephone Encounter (Signed)
Called patient to clarify what is going on. She started micardis on 5/28. She has now broke out in a rash (described it as very small bumps) on her chest, neck, arms, and some on her face. Confirmed no sob, no lip/face/tongue swelling. She is itching all over. No other acute symptoms. Took a benadryl but she didn't feel that it really helped- just made her sleepy. Advised patient to stop the micardis and be evaluated ASAP if she develops any lip/face/tongue swelling, sob, or any other new acute symptoms since stopping the medication. Patient gave verbal understanding. Advised that I would follow up with her tomorrow to see how she is doing.

## 2022-10-28 NOTE — Telephone Encounter (Signed)
LMTCB

## 2022-10-28 NOTE — Telephone Encounter (Signed)
FYI-  Patient is aware and is going to urgent care this afternoon to be seen.

## 2022-10-29 ENCOUNTER — Other Ambulatory Visit: Payer: Self-pay | Admitting: Internal Medicine

## 2022-10-29 ENCOUNTER — Ambulatory Visit
Admission: RE | Admit: 2022-10-29 | Discharge: 2022-10-29 | Disposition: A | Discharge status: Home / Self Care | Payer: Commercial Managed Care - PPO | Source: Ambulatory Visit | Attending: Internal Medicine | Admitting: Internal Medicine

## 2022-10-29 DIAGNOSIS — Z1231 Encounter for screening mammogram for malignant neoplasm of breast: Secondary | ICD-10-CM

## 2022-10-29 DIAGNOSIS — K219 Gastro-esophageal reflux disease without esophagitis: Secondary | ICD-10-CM

## 2022-10-29 DIAGNOSIS — Z8742 Personal history of other diseases of the female genital tract: Secondary | ICD-10-CM

## 2022-10-29 DIAGNOSIS — M25562 Pain in left knee: Secondary | ICD-10-CM

## 2022-10-29 DIAGNOSIS — E7800 Pure hypercholesterolemia, unspecified: Secondary | ICD-10-CM

## 2022-10-29 DIAGNOSIS — Z1211 Encounter for screening for malignant neoplasm of colon: Secondary | ICD-10-CM

## 2022-10-29 DIAGNOSIS — Z124 Encounter for screening for malignant neoplasm of cervix: Secondary | ICD-10-CM

## 2022-10-29 DIAGNOSIS — I10 Essential (primary) hypertension: Secondary | ICD-10-CM

## 2022-11-02 ENCOUNTER — Telehealth: Payer: Self-pay

## 2022-11-02 ENCOUNTER — Other Ambulatory Visit: Payer: Self-pay | Admitting: Internal Medicine

## 2022-11-02 DIAGNOSIS — R9389 Abnormal findings on diagnostic imaging of other specified body structures: Secondary | ICD-10-CM

## 2022-11-02 NOTE — Telephone Encounter (Signed)
-----   Message from Dale Vanlue, MD sent at 11/02/2022  4:20 AM EDT ----- Order placed for gyn referral.  Someone should be contacting with appt date and time.

## 2022-11-02 NOTE — Telephone Encounter (Signed)
Noted  

## 2022-11-02 NOTE — Progress Notes (Signed)
Order placed for gyn referral.  

## 2022-11-02 NOTE — Telephone Encounter (Signed)
When patient returns call please let her know that Dr Lorin Picket has placed the referral to gynecology and someone should be contacting her with an appointment date and time.

## 2022-11-02 NOTE — Telephone Encounter (Signed)
Pt returned Trisha LPN call. Note below was read to her. Pt aware and understood. 

## 2022-11-10 ENCOUNTER — Other Ambulatory Visit: Payer: Self-pay | Admitting: Internal Medicine

## 2022-11-12 ENCOUNTER — Emergency Department: Payer: Commercial Managed Care - PPO

## 2022-11-12 ENCOUNTER — Emergency Department
Admission: EM | Admit: 2022-11-12 | Discharge: 2022-11-12 | Disposition: A | Discharge status: Home / Self Care | Payer: Commercial Managed Care - PPO | Attending: Emergency Medicine | Admitting: Emergency Medicine

## 2022-11-12 DIAGNOSIS — I10 Essential (primary) hypertension: Secondary | ICD-10-CM | POA: Diagnosis not present

## 2022-11-12 DIAGNOSIS — R519 Headache, unspecified: Secondary | ICD-10-CM | POA: Diagnosis present

## 2022-11-12 LAB — CBC
HCT: 39.4 % (ref 36.0–46.0)
Hemoglobin: 12.5 g/dL (ref 12.0–15.0)
MCH: 26 pg (ref 26.0–34.0)
MCHC: 31.7 g/dL (ref 30.0–36.0)
MCV: 82.1 fL (ref 80.0–100.0)
Platelets: 259 10*3/uL (ref 150–400)
RBC: 4.8 MIL/uL (ref 3.87–5.11)
RDW: 13.2 % (ref 11.5–15.5)
WBC: 6.7 10*3/uL (ref 4.0–10.5)
nRBC: 0 % (ref 0.0–0.2)

## 2022-11-12 LAB — BASIC METABOLIC PANEL
Anion gap: 9 (ref 5–15)
BUN: 11 mg/dL (ref 8–23)
CO2: 25 mmol/L (ref 22–32)
Calcium: 9.2 mg/dL (ref 8.9–10.3)
Chloride: 102 mmol/L (ref 98–111)
Creatinine, Ser: 0.82 mg/dL (ref 0.44–1.00)
GFR, Estimated: >60 mL/min (ref >60)
Glucose, Bld: 93 mg/dL (ref 70–99)
Potassium: 3.4 mmol/L — ABNORMAL LOW (ref 3.5–5.1)
Sodium: 136 mmol/L (ref 135–145)

## 2022-11-12 MED ORDER — LABETALOL HCL 5 MG/ML IV SOLN
20.0000 mg | Freq: Once | INTRAVENOUS | Status: AC
  Administered 2022-11-12: 20 mg via INTRAVENOUS
  Filled 2022-11-12: qty 4

## 2022-11-12 NOTE — ED Triage Notes (Signed)
Pt sts that she went to the [facility] for pain on the left side of her head going into her eye. Upon the triage process at [facility] pt bp was found to be over 200 sys.

## 2022-11-12 NOTE — ED Provider Notes (Signed)
[facility] Provider Note   Event Date/Time   First MD Initiated Contact with Patient 11/12/22 1120     (approximate) History  Hypertension  HPI Sydney Martinez is a 62 y.o. female who presents from urgent care after being found to be extremely hypertensive due to coming in for a left-sided headache and not taking any of her blood pressure medicine.  Patient states she has not taken any of her normally prescribed blood pressure medicine so far today.  Patient presented to urgent care earlier this morning for left facial pain that she describes as throbbing and feeling like her heartbeat is in her eyeball.  Patient then states when she presented to urgent care they noted her blood pressure to be in the 240s systolics despite her telling them that she had not taken her normal blood pressure medications that day, she was encouraged to come to the emergency department for further evaluation. ROS: Patient currently denies any vision changes, tinnitus, difficulty speaking, facial droop, sore throat, chest pain, shortness of breath, abdominal pain, nausea/vomiting/diarrhea, dysuria, or weakness/numbness/paresthesias in any extremity   Physical Exam  Triage Vital Signs: ED Triage Vitals  Enc Vitals Group     BP 11/12/22 1045 (!) 218/118     Pulse Rate 11/12/22 1045 82     Resp 11/12/22 1045 16     Temp 11/12/22 1045 98.1 F (36.7 C)     Temp Source 11/12/22 1045 Oral     SpO2 11/12/22 1045 100 %     Weight 11/12/22 1044 171 lb (77.6 kg)     Height 11/12/22 1044 5\' 3"  (1.6 m)     Head Circumference --      Peak Flow --      Pain Score 11/12/22 1044 5     Pain Loc --      Pain Edu? --      Excl. in GC? --    Most recent vital signs: Vitals:   11/12/22 1200 11/12/22 1247  BP: (!) 168/97 (!) 191/95  Pulse:  80  Resp:  16  Temp:    SpO2:  100%   General: Awake, oriented x4. CV:  Good peripheral perfusion.  Resp:  Normal effort.  Abd:  No distention.   Other:  Middle-aged overweight African-American female laying in bed in no acute distress ED Results / Procedures / Treatments  Labs (all labs ordered are listed, but only abnormal results are displayed) Labs Reviewed  BASIC METABOLIC PANEL - Abnormal; Notable for the following components:      Result Value   Potassium 3.4 (*)    All other components within normal limits  EXPECTORATED SPUTUM ASSESSMENT W GRAM STAIN, RFLX TO RESP C  CBC  RADIOLOGY ED MD interpretation: CT of the head without contrast interpreted by me shows no evidence of acute abnormalities including no intracerebral hemorrhage, obvious masses, or significant edema -Agree with radiology assessment Official radiology report(s): CT Head Wo Contrast  Result Date: 11/12/2022 CLINICAL DATA:  Provided history: Headache, new onset. EXAM: CT HEAD WITHOUT CONTRAST TECHNIQUE: Contiguous axial images were obtained from the base of the skull through the vertex without intravenous contrast. RADIATION DOSE REDUCTION: This exam was performed according to the departmental dose-optimization program which includes automated exposure control, adjustment of the mA and/or kV according to patient size and/or use of iterative reconstruction technique. COMPARISON:  None. FINDINGS: Brain: No age advanced or lobar predominant parenchymal atrophy. 2.5 cm focus of hypodensity within the mid-to-anterior left frontal  lobe white matter. There is no acute intracranial hemorrhage. No acute demarcated cortical infarct. No extra-axial fluid collection. No evidence of an intracranial mass. No midline shift. Vascular: No hyperdense vessel.  Atherosclerotic calcifications. Skull: No fracture or aggressive osseous lesion. Sinuses/Orbits: No mass or acute finding within the imaged orbits. No significant paranasal sinus disease at the imaged levels. IMPRESSION: 1. No acute intracranial hemorrhage or acute demarcated cortical infarct. 2. 2.5 cm focus of hypodensity within  the mid-to-anterior left frontal lobe white matter. This may reflect a chronic insult. However, a recent white matter infarct cannot be excluded. Consider a brain MRI for further evaluation. Electronically Signed   By: Jackey Loge D.O.   On: 11/12/2022 12:10   PROCEDURES: Critical Care performed: No .1-3 Lead EKG Interpretation  Performed by: Merwyn Katos, MD Authorized by: Merwyn Katos, MD     Interpretation: normal     ECG rate:  71   ECG rate assessment: normal     Rhythm: sinus rhythm     Ectopy: none     Conduction: normal    MEDICATIONS ORDERED IN ED: Medications  labetalol (NORMODYNE) injection 20 mg (20 mg Intravenous Given 11/12/22 1148)   IMPRESSION / MDM / ASSESSMENT AND PLAN / ED COURSE  I reviewed the triage vital signs and the nursing notes.                             The patient is on the cardiac monitor to evaluate for evidence of arrhythmia and/or significant heart rate changes. Patient's presentation is most consistent with acute presentation with potential threat to life or bodily function. Presents to the emergency department complaining of high blood pressure. Patient is otherwise asymptomatic without confusion, chest pain, hematuria, or SOB. Endorses nonadherence to antihypertensive regimen DDx: CV, AMI, heart failure, renal infarction or failure or other end organ damage. Spoke to patient at length about the results of her CT scan.  Patient has no complaints of any neurologic deficits at this time and patient agrees to follow-up with her normal primary care physician for further evaluation should symptoms recur Disposition: Discussed with patient their elevated blood pressure and need for close outpatient management of their hypertension. Will provide a prescription for the patients previous antihypertensive medication and arrange for the patient to follow up in a primary care clinic    FINAL CLINICAL IMPRESSION(S) / ED DIAGNOSES   Final diagnoses:   Uncontrolled hypertension   Rx / DC Orders   ED Discharge Orders     None      Note:  This document was prepared using Dragon voice recognition software and may include unintentional dictation errors.   Merwyn Katos, MD 11/12/22 1540

## 2022-11-12 NOTE — Discharge Instructions (Addendum)
Please take your normally prescribed blood pressure medications today when you get home.

## 2022-11-15 ENCOUNTER — Telehealth: Payer: Self-pay

## 2022-11-15 DIAGNOSIS — Z0000 Encounter for general adult medical examination without abnormal findings: Secondary | ICD-10-CM | POA: Insufficient documentation

## 2022-11-15 NOTE — Transitions of Care (Post Inpatient/ED Visit) (Signed)
Unable to reach pt by phone and left v/m requesting pt call [phone].      11/15/2022  Name: Sydney Martinez MRN: [id number] DOB: 12-23-1960  Today's TOC FU Call Status: Today's TOC FU Call Status:: Unsuccessul Call (1st Attempt) Unsuccessful Call (1st Attempt) Date: 11/15/22  Attempted to reach the patient regarding the most recent Inpatient/ED visit.  Follow Up Plan: Additional outreach attempts will be made to reach the patient to complete the Transitions of Care (Post Inpatient/ED visit) call.   Signature  Lewanda Rife, LPN

## 2022-11-15 NOTE — Assessment & Plan Note (Addendum)
Physical today 11/16/22.  Colonoscopy 10/2020 - one 4mm polyp in descending colon.  Internal hemorrhoids.  Diverticulosis.  Pathology - polypoid colonic mucosa - negative. Due for a pap smear.  Needs mammogram.

## 2022-11-15 NOTE — Progress Notes (Signed)
Subjective:    Patient ID: Sydney Martinez, female    DOB: 04-12-1961, 62 y.o.   MRN: [id number]  Patient here for  Chief Complaint  Patient presents with   Annual Exam    HPI Here for a physical exam. Was seen recently - new patient. Blood pressure elevated. Started back on amlodipine 10mg  q day. Last visit, micardis was added due to persistent elevated blood pressure.  Had allergic reaction - rash - after starting. Felt initially was related to micardis, but has continued to take and no rash.  Reports rash was related to new perfume. She was seen ER 11/12/22 - headache and elevated blood pressure. CT head - no acute intracranial hemorrhage or acute demarcated cortical infarct. 2.5 cm focus of hypodensity within the mid-to-anterior left frontal lobe white matter. This may reflect a chronic insult. However, a recent white matter infarct cannot be excluded. Recommended to consider a brain MRI for further evaluation.  Discussed with her today.  Head is better.  Still with left side/temporal pressure.  No vision loss.  States can't see as well out of left eye compared to the right, but no acute vision loss.  No visual field defects.  Initially denied dizziness.  Later in visit, reported that she feels the micardis makes her dizzy.  Reports that after she takes micardis, will notice feeling a little dizzy and then resolves. No chest pain.  No sob.  No cough or congestion.  No acid reflux.  No abdominal pain or cramping.  Bowels moving.  Saw Jacob Moores 08/19/20 - recommended EGD and colonoscopy. Colonoscopy 10/2020 - one 4 mm polyp in the descending colon and diverticulosis.  Also internal hemorrhoids and decreased sphincter tone.  EGD - mild schatzki ring and reflux esophagitis.  Blood pressure remaining elevated.  Taking amlodipine and micardis.     Past Medical History:  Diagnosis Date   Arthritis    Asthma    GERD (gastroesophageal reflux disease)    Hyperlipidemia    Hypertension    Past  Surgical History:  Procedure Laterality Date   APPENDECTOMY     Family History  Problem Relation Age of Onset   Hyperlipidemia Mother    Arthritis Mother    Hyperlipidemia Father    Hyperlipidemia Sister    Social History   Socioeconomic History   Marital status: Married    Spouse name: Not on file   Number of children: Not on file   Years of education: Not on file   Highest education level: Not on file  Occupational History   Not on file  Tobacco Use   Smoking status: Never   Smokeless tobacco: Never  Substance and Sexual Activity   Alcohol use: No    Alcohol/week: 0.0 standard drinks of alcohol   Drug use: No   Sexual activity: Yes  Other Topics Concern   Not on file  Social History Narrative   Not on file   Social Determinants of Health   Financial Resource Strain: Not on file  Food Insecurity: Not on file  Transportation Needs: Not on file  Physical Activity: Not on file  Stress: Not on file  Social Connections: Not on file     Review of Systems  Constitutional:  Negative for appetite change and unexpected weight change.  HENT:  Negative for congestion, sinus pressure and sore throat.   Respiratory:  Negative for cough, chest tightness and shortness of breath.   Cardiovascular:  Negative for chest pain, palpitations and leg  swelling.  Gastrointestinal:  Negative for abdominal pain, diarrhea, nausea and vomiting.  Genitourinary:  Negative for difficulty urinating and dysuria.  Musculoskeletal:  Negative for joint swelling and myalgias.  Skin:  Negative for color change and rash.  Neurological:  Negative for syncope.       Left side head pain/pressure as outlined.  No unsteadiness noted.   Psychiatric/Behavioral:  Negative for agitation and dysphoric mood.        Objective:     BP (!) 150/90   Pulse 82   Temp 97.9 F (36.6 C)   Resp 16   Ht 5\' 3"  (1.6 m)   Wt 170 lb (77.1 kg)   LMP  (LMP Unknown)   SpO2 98%   BMI 30.11 kg/m  Wt Readings from  Last 3 Encounters:  11/16/22 170 lb (77.1 kg)  11/12/22 171 lb (77.6 kg)  10/19/22 171 lb 3.2 oz (77.7 kg)    Physical Exam Vitals reviewed.  Constitutional:      General: She is not in acute distress.    Appearance: Normal appearance.  HENT:     Head: Normocephalic and atraumatic.     Right Ear: External ear normal.     Left Ear: External ear normal.  Eyes:     General: No scleral icterus.       Right eye: No discharge.        Left eye: No discharge.     Conjunctiva/sclera: Conjunctivae normal.  Neck:     Thyroid: No thyromegaly.  Cardiovascular:     Rate and Rhythm: Normal rate and regular rhythm.  Pulmonary:     Effort: No respiratory distress.     Breath sounds: Normal breath sounds. No wheezing.  Abdominal:     General: Bowel sounds are normal.     Palpations: Abdomen is soft.     Tenderness: There is no abdominal tenderness.  Musculoskeletal:        General: No swelling or tenderness.     Cervical back: Neck supple. No tenderness.  Lymphadenopathy:     Cervical: No cervical adenopathy.  Skin:    Findings: No erythema or rash.  Neurological:     General: No focal deficit present.     Mental Status: She is alert.  Psychiatric:        Mood and Affect: Mood normal.        Behavior: Behavior normal.      Outpatient Encounter Medications as of 11/16/2022  Medication Sig   valsartan (DIOVAN) 80 MG tablet Take 1 tablet (80 mg total) by mouth daily.   amLODipine (NORVASC) 10 MG tablet TAKE 1 TABLET BY MOUTH EVERY DAY   atorvastatin (LIPITOR) 40 MG tablet TAKE 1 TABLET BY MOUTH EVERY DAY   cetirizine (ZYRTEC) 10 MG tablet cetirizine 10 mg tablet  TAKE 1 TABLET BY MOUTH EVERY DAY   pantoprazole (PROTONIX) 40 MG tablet TAKE 1 TABLET BY MOUTH EVERY DAY   [DISCONTINUED] fluticasone (FLONASE) 50 MCG/ACT nasal spray INSTILL 2 SPRAYS INTO EACH NOSTRIL EVERY DAY   [DISCONTINUED] meloxicam (MOBIC) 15 MG tablet Take 15 mg by mouth daily.   [DISCONTINUED] telmisartan  (MICARDIS) 20 MG tablet TAKE 1 TABLET BY MOUTH EVERY DAY   No facility-administered encounter medications on file as of 11/16/2022.     Lab Results  Component Value Date   WBC 6.3 11/16/2022   HGB 11.7 (L) 11/16/2022   HCT 36.9 11/16/2022   PLT 267.0 11/16/2022   GLUCOSE 101 (H) 11/16/2022  CHOL 260 (H) 09/23/2022   TRIG (H) 09/23/2022    502.0 Triglyceride is over 400; calculations on Lipids are invalid.   HDL 30.90 (L) 09/23/2022   LDLDIRECT 175.0 09/23/2022   ALT 24 09/23/2022   AST 34 09/23/2022   NA 138 11/16/2022   K 3.6 11/16/2022   CL 102 11/16/2022   CREATININE 0.92 11/16/2022   BUN 12 11/16/2022   CO2 27 11/16/2022   TSH 1.13 09/23/2022    CT Head Wo Contrast  Result Date: 11/12/2022 CLINICAL DATA:  Provided history: Headache, new onset. EXAM: CT HEAD WITHOUT CONTRAST TECHNIQUE: Contiguous axial images were obtained from the base of the skull through the vertex without intravenous contrast. RADIATION DOSE REDUCTION: This exam was performed according to the departmental dose-optimization program which includes automated exposure control, adjustment of the mA and/or kV according to patient size and/or use of iterative reconstruction technique. COMPARISON:  None. FINDINGS: Brain: No age advanced or lobar predominant parenchymal atrophy. 2.5 cm focus of hypodensity within the mid-to-anterior left frontal lobe white matter. There is no acute intracranial hemorrhage. No acute demarcated cortical infarct. No extra-axial fluid collection. No evidence of an intracranial mass. No midline shift. Vascular: No hyperdense vessel.  Atherosclerotic calcifications. Skull: No fracture or aggressive osseous lesion. Sinuses/Orbits: No mass or acute finding within the imaged orbits. No significant paranasal sinus disease at the imaged levels. IMPRESSION: 1. No acute intracranial hemorrhage or acute demarcated cortical infarct. 2. 2.5 cm focus of hypodensity within the mid-to-anterior left frontal  lobe white matter. This may reflect a chronic insult. However, a recent white matter infarct cannot be excluded. Consider a brain MRI for further evaluation. Electronically Signed   By: Jackey Loge D.O.   On: 11/12/2022 12:10       Assessment & Plan:  Health care maintenance Assessment & Plan: Physical today 11/16/22.  Colonoscopy 10/2020 - one 4mm polyp in descending colon.  Internal hemorrhoids.  Diverticulosis.  Pathology - polypoid colonic mucosa - negative. Due for a pap smear.  Needs mammogram.    Abnormal CT of the head -     MR BRAIN W WO CONTRAST; Future  Nonintractable headache, unspecified chronicity pattern, unspecified headache type Assessment & Plan: Headache as outlined.  Treat blood pressure.  Now head pressure - left temple.  Adjust blood pressure medication as outlined.  Check cbc and sed rate.  Reviewed head CT from ER visit - CT head - no acute intracranial hemorrhage or acute demarcated cortical infarct. 2.5 cm focus of hypodensity within the mid-to-anterior left frontal lobe white matter. This may reflect a chronic insult. However, a recent white matter infarct cannot be excluded. Recommended to consider a brain MRI for further evaluation. Discussed with her today.  Check MRI.  Start ECASA 81mg  q day. Any acute change in symptoms or problems, needs to be evaluated.    Orders: -     CBC with Differential/Platelet -     Basic metabolic panel -     Sedimentation rate -     Ambulatory referral to Pulmonology  Snoring -     Ambulatory referral to Pulmonology  Hypertension, unspecified type Assessment & Plan: Blood pressure on my check today 148/94 and 150/88.  Currently on amlodipine 10mg  q day and micardis 20mg  q day.  No allergy to micardis.  Feels she is not tolerating.  Will change micardis to diovan 80mg  q day.  Have her split medication.  (Take one in am and one in pm).  Hold on  diuretic since potassium slightly decreased.  Recheck metabolic panel.  Follow pressures.   Get her back in soon to reassess.    Hypercholesteremia Assessment & Plan: Continue lipitor.  Low cholesterol diet and exercise.    Anemia, unspecified type Assessment & Plan: Reports has a history of anemia. Previously had required iron.  Recheck cbc today.   Orders: -     IBC + Ferritin; Future -     Vitamin B12; Future  Cervical cancer screening Assessment & Plan: Overdue.  Get her back in soon for pap smear.    Colon cancer screening Assessment & Plan: Colonoscopy 10/2020 - one 4mm polyp in descending colon.  Internal hemorrhoids.  Diverticulosis.  Pathology - polypoid colonic mucosa - negative.    Gastroesophageal reflux disease, unspecified whether esophagitis present Assessment & Plan: Continues PPI.  Follow.  EGD as outlined - 10/2020.     History of ovarian cyst Assessment & Plan: Had recent pelvic ultrasound.  No ovarian cyst.  Uterine fibroids present.  Also thickened endometrium.  Has been referred to gyn for further evaluation.    Other orders -     Valsartan; Take 1 tablet (80 mg total) by mouth daily.  Dispense: 30 tablet; Refill: 2     Dale [place], MD

## 2022-11-16 ENCOUNTER — Ambulatory Visit (INDEPENDENT_AMBULATORY_CARE_PROVIDER_SITE_OTHER): Payer: Commercial Managed Care - PPO | Admitting: Internal Medicine

## 2022-11-16 ENCOUNTER — Other Ambulatory Visit: Payer: Commercial Managed Care - PPO

## 2022-11-16 ENCOUNTER — Ambulatory Visit
Admission: RE | Admit: 2022-11-16 | Discharge: 2022-11-16 | Disposition: A | Discharge status: Home / Self Care | Payer: Commercial Managed Care - PPO | Source: Ambulatory Visit | Attending: Internal Medicine | Admitting: Internal Medicine

## 2022-11-16 ENCOUNTER — Telehealth: Payer: Self-pay | Admitting: Internal Medicine

## 2022-11-16 ENCOUNTER — Encounter: Payer: Self-pay | Admitting: Internal Medicine

## 2022-11-16 ENCOUNTER — Other Ambulatory Visit: Payer: Self-pay | Admitting: Internal Medicine

## 2022-11-16 ENCOUNTER — Telehealth: Payer: Self-pay

## 2022-11-16 VITALS — BP 150/90 | HR 82 | Temp 97.9°F | Resp 16 | Ht 63.0 in | Wt 170.0 lb

## 2022-11-16 DIAGNOSIS — R0683 Snoring: Secondary | ICD-10-CM | POA: Diagnosis not present

## 2022-11-16 DIAGNOSIS — Z8742 Personal history of other diseases of the female genital tract: Secondary | ICD-10-CM

## 2022-11-16 DIAGNOSIS — Z0000 Encounter for general adult medical examination without abnormal findings: Secondary | ICD-10-CM | POA: Diagnosis not present

## 2022-11-16 DIAGNOSIS — R930 Abnormal findings on diagnostic imaging of skull and head, not elsewhere classified: Secondary | ICD-10-CM

## 2022-11-16 DIAGNOSIS — R519 Headache, unspecified: Secondary | ICD-10-CM

## 2022-11-16 DIAGNOSIS — I10 Essential (primary) hypertension: Secondary | ICD-10-CM | POA: Diagnosis not present

## 2022-11-16 DIAGNOSIS — E7800 Pure hypercholesterolemia, unspecified: Secondary | ICD-10-CM

## 2022-11-16 DIAGNOSIS — Z124 Encounter for screening for malignant neoplasm of cervix: Secondary | ICD-10-CM

## 2022-11-16 DIAGNOSIS — D649 Anemia, unspecified: Secondary | ICD-10-CM

## 2022-11-16 DIAGNOSIS — K219 Gastro-esophageal reflux disease without esophagitis: Secondary | ICD-10-CM

## 2022-11-16 DIAGNOSIS — Z1211 Encounter for screening for malignant neoplasm of colon: Secondary | ICD-10-CM

## 2022-11-16 LAB — BASIC METABOLIC PANEL
BUN: 12 mg/dL (ref 6–23)
CO2: 27 mEq/L (ref 19–32)
Calcium: 10.1 mg/dL (ref 8.4–10.5)
Chloride: 102 mEq/L (ref 96–112)
Creatinine, Ser: 0.92 mg/dL (ref 0.40–1.20)
GFR: 66.29 mL/min (ref >60.00)
Glucose, Bld: 101 mg/dL — ABNORMAL HIGH (ref 70–99)
Potassium: 3.6 mEq/L (ref 3.5–5.1)
Sodium: 138 mEq/L (ref 135–145)

## 2022-11-16 LAB — CBC WITH DIFFERENTIAL/PLATELET
Basophils Absolute: 0.1 10*3/uL (ref 0.0–0.1)
Basophils Relative: 0.8 % (ref 0.0–3.0)
Eosinophils Absolute: 0.1 10*3/uL (ref 0.0–0.7)
Eosinophils Relative: 1 % (ref 0.0–5.0)
HCT: 36.9 % (ref 36.0–46.0)
Hemoglobin: 11.7 g/dL — ABNORMAL LOW (ref 12.0–15.0)
Lymphocytes Relative: 37.5 % (ref 12.0–46.0)
Lymphs Abs: 2.4 10*3/uL (ref 0.7–4.0)
MCHC: 31.7 g/dL (ref 30.0–36.0)
MCV: 82 fl (ref 78.0–100.0)
Monocytes Absolute: 0.4 10*3/uL (ref 0.1–1.0)
Monocytes Relative: 6.1 % (ref 3.0–12.0)
Neutro Abs: 3.5 10*3/uL (ref 1.4–7.7)
Neutrophils Relative %: 54.6 % (ref 43.0–77.0)
Platelets: 267 10*3/uL (ref 150.0–400.0)
RBC: 4.5 Mil/uL (ref 3.87–5.11)
RDW: 14.1 % (ref 11.5–15.5)
WBC: 6.3 10*3/uL (ref 4.0–10.5)

## 2022-11-16 LAB — SEDIMENTATION RATE: Sed Rate: 34 mm/hr — ABNORMAL HIGH (ref 0–30)

## 2022-11-16 MED ORDER — GADOBUTROL 1 MMOL/ML IV SOLN
7.0000 mL | Freq: Once | INTRAVENOUS | Status: AC | PRN
  Administered 2022-11-16: 7 mL via INTRAVENOUS

## 2022-11-16 MED ORDER — VALSARTAN 80 MG PO TABS
80.0000 mg | ORAL_TABLET | Freq: Every day | ORAL | 2 refills | Status: DC
Start: 2022-11-16 — End: 2023-02-28

## 2022-11-16 NOTE — Assessment & Plan Note (Signed)
Headache as outlined.  Treat blood pressure.  Now head pressure - left temple.  Adjust blood pressure medication as outlined.  Check cbc and sed rate.  Reviewed head CT from ER visit - CT head - no acute intracranial hemorrhage or acute demarcated cortical infarct. 2.5 cm focus of hypodensity within the mid-to-anterior left frontal lobe white matter. This may reflect a chronic insult. However, a recent white matter infarct cannot be excluded. Recommended to consider a brain MRI for further evaluation. Discussed with her today.  Check MRI.  Start ECASA 81mg  q day. Any acute change in symptoms or problems, needs to be evaluated.

## 2022-11-16 NOTE — Telephone Encounter (Signed)
Lft pt vm to call ofc to sch STAT MRI. Thank you!

## 2022-11-16 NOTE — Assessment & Plan Note (Addendum)
Blood pressure on my check today 148/94 and 150/88.  Currently on amlodipine 10mg  q day and micardis 20mg  q day.  No allergy to micardis.  Feels she is not tolerating.  Will change micardis to diovan 80mg  q day.  Have her split medication.  (Take one in am and one in pm).  Hold on diuretic since potassium slightly decreased.  Recheck metabolic panel.  Follow pressures.  Get her back in soon to reassess.

## 2022-11-16 NOTE — Assessment & Plan Note (Addendum)
Continue lipitor.  Low cholesterol diet and exercise.   ?

## 2022-11-16 NOTE — Telephone Encounter (Signed)
Noted  

## 2022-11-16 NOTE — Telephone Encounter (Signed)
Good afternoon!  Dr Lorin Picket placed a referral for OBGYN on 6/11. I called [facility] to go ahead and schedule her and they advised referral was not received. Can you please resend so they can schedule her?

## 2022-11-16 NOTE — Transitions of Care (Post Inpatient/ED Visit) (Signed)
I spoke with pt; pt was seen [facility] ED on 11/12/22 with Hypertension; pt said might have missed one day of BP med. pt was having pressure feeling at left eye. Pt said still has slight pressure at lt eye but not as bad as when seen at ED. Pt already has appt scheduled for n06/25/24 at 11:30 with Dr Dale [place]. Sendfing note to Dr Dale [place].      11/16/2022  Name: Kayleanna Lorman MRN: [id number] DOB: 09/19/1960  Today's TOC FU Call Status: Today's TOC FU Call Status:: Successful TOC FU Call Competed Unsuccessful Call (1st Attempt) Date: 11/15/22 [facility] FU Call Complete Date: 11/16/22  Transition Care Management Follow-up Telephone Call Date of Discharge: 11/12/22 Discharge Facility: [facility]) Type of Discharge:  (Hypertension; pt said might have missed one day of BP med. pt was having pressure feeling at left eye.) How have you been since you were released from the hospital?: Better (pressure at left eye is better but still slightly there.) Any questions or concerns?: No  Items Reviewed: Did you receive and understand the discharge instructions provided?: Yes Medications obtained,verified, and reconciled?: Yes (Medications Reviewed) (reviewed med list with pt and pt is aware how to take meds.) Any new allergies since your discharge?: No Dietary orders reviewed?: NA Do you have support at home?: Yes People in Home: spouse Name of Support/Comfort Primary Source: Lollie Sails  Medications Reviewed Today: Medications Reviewed Today     Reviewed by Dale [place], MD (Physician) on 10/24/22 at 1428  Med List Status: <None>   Medication Order Taking? Sig Documenting Provider Last Dose Status Informant  amLODipine (NORVASC) 10 MG tablet [id number] Yes TAKE 1 TABLET BY MOUTH EVERY DAY Dale [place], MD Taking Active   atorvastatin (LIPITOR) 40 MG tablet [id number] Yes Take 1 tablet (40 mg total) by mouth daily. Dale [place], MD Taking Active   cetirizine  (ZYRTEC) 10 MG tablet [id number]  cetirizine 10 mg tablet  TAKE 1 TABLET BY MOUTH EVERY DAY [provider]  Active   fluticasone (FLONASE) 50 MCG/ACT nasal spray [id number]  INSTILL 2 SPRAYS INTO EACH NOSTRIL EVERY DAY [provider]  Active   meloxicam (MOBIC) 15 MG tablet [id number] Yes Take 15 mg by mouth daily. [provider]  Active   pantoprazole (PROTONIX) 40 MG tablet [id number]  Take 1 tablet (40 mg total) by mouth daily. Dale [place], MD  Active   telmisartan (MICARDIS) 20 MG tablet [id number] Yes Take 1 tablet (20 mg total) by mouth daily. Dale [place], MD  Active             Home Care and Equipment/Supplies: Were Home Health Services Ordered?: NA Any new equipment or medical supplies ordered?: NA  Functional Questionnaire: Do you need assistance with bathing/showering or dressing?: No Do you need assistance with meal preparation?: No Do you need assistance with eating?: No Do you have difficulty maintaining continence: No Do you need assistance with getting out of bed/getting out of a chair/moving?: No Do you have difficulty managing or taking your medications?: No  Follow up appointments reviewed: PCP Follow-up appointment confirmed?: Yes Date of PCP follow-up appointment?: 11/16/22 Follow-up Provider: Dr Westley Hummer [facility] Follow-up appointment confirmed?: NA Do you need transportation to your follow-up appointment?: No Do you understand care options if your condition(s) worsen?: Yes-patient verbalized understanding    SIGNATURE Lewanda Rife, LPN

## 2022-11-16 NOTE — Addendum Note (Signed)
Addended by: Patience Musca on: 11/16/2022 09:28 AM   Modules accepted: Orders

## 2022-11-17 ENCOUNTER — Ambulatory Visit (INDEPENDENT_AMBULATORY_CARE_PROVIDER_SITE_OTHER): Payer: Commercial Managed Care - PPO

## 2022-11-17 DIAGNOSIS — D649 Anemia, unspecified: Secondary | ICD-10-CM | POA: Diagnosis not present

## 2022-11-17 LAB — IBC + FERRITIN
Ferritin: 60.5 ng/mL (ref 10.0–291.0)
Iron: 68 ug/dL (ref 42–145)
Saturation Ratios: 19.4 % — ABNORMAL LOW (ref 20.0–50.0)
TIBC: 351.4 ug/dL (ref 250.0–450.0)
Transferrin: 251 mg/dL (ref 212.0–360.0)

## 2022-11-17 LAB — VITAMIN B12: Vitamin B-12: 258 pg/mL (ref 211–911)

## 2022-11-17 NOTE — Assessment & Plan Note (Signed)
Reports has a history of anemia. Previously had required iron.  Recheck cbc today.

## 2022-11-19 ENCOUNTER — Telehealth: Payer: Self-pay

## 2022-11-19 NOTE — Telephone Encounter (Signed)
Noted  

## 2022-11-19 NOTE — Telephone Encounter (Signed)
Patient states she is returning our call.  I read Dr. Westley Hummer Scott's message to patient.  Patient states she is willing to have B12 injections, I will schedule appointments for injections.

## 2022-11-19 NOTE — Telephone Encounter (Signed)
-----   Message from Dale [place], MD sent at 11/17/2022 11:04 PM EDT ----- I notified her of previous lab results and had informed her that I would be adding iron studies and B12 level to her blood drawn.  Notify her that her iron level is ok.  B12 is normal, but at low end of normal.  I would like to start B12 injections - q week x 4 weeks and then q month.

## 2022-11-21 ENCOUNTER — Encounter: Payer: Self-pay | Admitting: Internal Medicine

## 2022-11-21 NOTE — Assessment & Plan Note (Signed)
Colonoscopy 10/2020 - one 4mm polyp in descending colon.  Internal hemorrhoids.  Diverticulosis.  Pathology - polypoid colonic mucosa - negative.  

## 2022-11-21 NOTE — Assessment & Plan Note (Signed)
Overdue.  Get her back in soon for pap smear.  

## 2022-11-21 NOTE — Assessment & Plan Note (Signed)
Continues PPI.  Follow.  EGD as outlined - 10/2020.   

## 2022-11-21 NOTE — Assessment & Plan Note (Signed)
Had recent pelvic ultrasound.  No ovarian cyst.  Uterine fibroids present.  Also thickened endometrium.  Has been referred to gyn for further evaluation.

## 2022-11-22 ENCOUNTER — Ambulatory Visit (INDEPENDENT_AMBULATORY_CARE_PROVIDER_SITE_OTHER): Payer: Commercial Managed Care - PPO

## 2022-11-22 DIAGNOSIS — E538 Deficiency of other specified B group vitamins: Secondary | ICD-10-CM

## 2022-11-22 MED ORDER — CYANOCOBALAMIN 1000 MCG/ML IJ SOLN
1000.0000 ug | Freq: Once | INTRAMUSCULAR | Status: AC
Start: 2022-11-22 — End: 2022-11-22
  Administered 2022-11-22: 1000 ug via INTRAMUSCULAR

## 2022-11-22 NOTE — Progress Notes (Signed)
Pt presented for their vitamin B12 injection. Pt was identified through two identifiers. Pt tolerated shot well in their left  deltoid.  

## 2022-11-30 ENCOUNTER — Ambulatory Visit: Payer: Commercial Managed Care - PPO

## 2022-12-13 ENCOUNTER — Ambulatory Visit (INDEPENDENT_AMBULATORY_CARE_PROVIDER_SITE_OTHER): Payer: Commercial Managed Care - PPO

## 2022-12-13 DIAGNOSIS — E538 Deficiency of other specified B group vitamins: Secondary | ICD-10-CM

## 2022-12-13 MED ORDER — CYANOCOBALAMIN 1000 MCG/ML IJ SOLN
1000.0000 ug | Freq: Once | INTRAMUSCULAR | Status: AC
Start: 2022-12-13 — End: 2022-12-13
  Administered 2022-12-13: 1000 ug via INTRAMUSCULAR

## 2022-12-13 NOTE — Progress Notes (Signed)
After obtaining consent, and per orders of Dr. Lorin Picket, injection of B12 was given in right deltoid by Kristie Cowman. Patient tolerated injection well.

## 2022-12-17 ENCOUNTER — Ambulatory Visit: Payer: Commercial Managed Care - PPO | Admitting: Internal Medicine

## 2022-12-20 ENCOUNTER — Ambulatory Visit (INDEPENDENT_AMBULATORY_CARE_PROVIDER_SITE_OTHER): Payer: Commercial Managed Care - PPO

## 2022-12-20 DIAGNOSIS — E538 Deficiency of other specified B group vitamins: Secondary | ICD-10-CM

## 2022-12-20 MED ORDER — CYANOCOBALAMIN 1000 MCG/ML IJ SOLN
1000.0000 ug | Freq: Once | INTRAMUSCULAR | Status: AC
Start: 2022-12-20 — End: 2022-12-20
  Administered 2022-12-20: 1000 ug via INTRAMUSCULAR

## 2022-12-20 NOTE — Progress Notes (Signed)
Patient arrived for a B12 injection and it was administered into her left deltoid. Patient tolerated the injection well and did not show any signs of distress or voice any concerns. 

## 2022-12-22 ENCOUNTER — Encounter (INDEPENDENT_AMBULATORY_CARE_PROVIDER_SITE_OTHER): Payer: Self-pay

## 2023-01-17 ENCOUNTER — Ambulatory Visit: Payer: Commercial Managed Care - PPO

## 2023-01-17 NOTE — Progress Notes (Deleted)
Pt presented for their vitamin B12 injection. Pt was identified through two identifiers. Pt tolerated shot well in their left or right deltoid.  

## 2023-01-20 ENCOUNTER — Ambulatory Visit: Payer: Commercial Managed Care - PPO

## 2023-02-16 ENCOUNTER — Telehealth: Payer: Self-pay | Admitting: Internal Medicine

## 2023-02-16 NOTE — Telephone Encounter (Signed)
Patient just walked in and needs proof of physical form signed. The form is in the colorful folder. She will pick it up when it's ready. Her number is [phone]

## 2023-02-17 NOTE — Telephone Encounter (Signed)
LM for patient. Form placed up front for pick up.

## 2023-02-25 ENCOUNTER — Other Ambulatory Visit: Payer: Self-pay | Admitting: Internal Medicine

## 2023-04-29 ENCOUNTER — Ambulatory Visit
Admission: EM | Admit: 2023-04-29 | Discharge: 2023-04-29 | Disposition: A | Discharge status: Home / Self Care | Payer: Commercial Managed Care - PPO | Attending: Internal Medicine | Admitting: Internal Medicine

## 2023-04-29 DIAGNOSIS — J069 Acute upper respiratory infection, unspecified: Secondary | ICD-10-CM | POA: Diagnosis not present

## 2023-04-29 MED ORDER — FLUTICASONE PROPIONATE 50 MCG/ACT NA SUSP: 1.0000 | Freq: Every day | NASAL | 0 refills | Status: AC

## 2023-04-29 MED ORDER — BENZONATATE 200 MG PO CAPS: 200.0000 mg | ORAL_CAPSULE | Freq: Three times a day (TID) | ORAL | 0 refills | Status: DC

## 2023-04-29 NOTE — ED Triage Notes (Signed)
Cough, congestion, scratchy throat that started yesterday. Taking tylenol with no relief of symptoms.

## 2023-04-29 NOTE — ED Provider Notes (Signed)
Renaldo Fiddler    CSN: [id number] Arrival date & time: 04/29/23  4098      History   Chief Complaint Chief Complaint  Patient presents with   Cough    HPI Shemya Velic is a 62 y.o. female comes to the urgent care with 4-day history of nasal congestion, postnasal drainage and cough.  Patient's symptoms have been persistent.  Cough has been bothersome at night.  No shortness of breath or wheezing.  No fever or chills.  No known sick contacts.  No chest pain or chest pressure.  Patient works in a nursing home.  She tested negative for COVID test.  Patient denies any chest tightness.  No history of tobacco use.  No nausea, vomiting or diarrhea. Patient denies any chest pain, headache or abdominal pain.  She has a history of high blood pressure and her blood pressure is elevated today.  HPI  Past Medical History:  Diagnosis Date   Arthritis    Asthma    GERD (gastroesophageal reflux disease)    Hyperlipidemia    Hypertension     Patient Active Problem List   Diagnosis Date Noted   Anemia 11/17/2022   Snoring 11/16/2022   Headache 11/16/2022   Health care maintenance 11/15/2022   Left knee pain 10/19/2022   Colon cancer screening 09/29/2022   Hemorrhoid 09/26/2022   Cervical cancer screening 09/26/2022   History of ovarian cyst 09/26/2022   Hypertension 09/23/2022   Hypercholesteremia 09/23/2022   History of rectal bleeding 09/23/2022   GERD (gastroesophageal reflux disease) 09/23/2022   Trigger finger of left hand 11/09/2016    Past Surgical History:  Procedure Laterality Date   APPENDECTOMY      OB History   No obstetric history on file.      Home Medications    Prior to Admission medications   Medication Sig Start Date End Date Taking? Authorizing Provider  amLODipine (NORVASC) 10 MG tablet TAKE 1 TABLET BY MOUTH EVERY DAY 10/19/22  Yes Dale [place], MD  atorvastatin (LIPITOR) 40 MG tablet TAKE 1 TABLET BY MOUTH EVERY DAY 10/28/22  Yes Dale [place], MD  benzonatate (TESSALON) 200 MG capsule Take 1 capsule (200 mg total) by mouth every 8 (eight) hours. 04/29/23  Yes Eisha Chatterjee, Britta Mccreedy, MD  cetirizine (ZYRTEC) 10 MG tablet cetirizine 10 mg tablet  TAKE 1 TABLET BY MOUTH EVERY DAY   Yes [provider]  fluticasone (FLONASE) 50 MCG/ACT nasal spray Place 1 spray into both nostrils daily. 04/29/23  Yes Beckett Hickmon, Britta Mccreedy, MD  pantoprazole (PROTONIX) 40 MG tablet TAKE 1 TABLET BY MOUTH EVERY DAY 10/28/22  Yes Dale [place], MD  valsartan (DIOVAN) 80 MG tablet TAKE 1 TABLET BY MOUTH EVERY DAY 02/28/23  Yes Dale [place], MD    Family History Family History  Problem Relation Age of Onset   Hyperlipidemia Mother    Arthritis Mother    Hyperlipidemia Father    Hyperlipidemia Sister     Social History Social History   Tobacco Use   Smoking status: Never   Smokeless tobacco: Never  Substance Use Topics   Alcohol use: No    Alcohol/week: 0.0 standard drinks of alcohol   Drug use: No     Allergies   Patient has no known allergies.   Review of Systems Review of Systems As per HPI  Physical Exam Triage Vital Signs ED Triage Vitals  Encounter Vitals Group     BP 04/29/23 0928 (!) 177/100  Systolic BP Percentile --      Diastolic BP Percentile --      Pulse Rate 04/29/23 0928 83     Resp 04/29/23 0928 16     Temp 04/29/23 0928 98.8 F (37.1 C)     Temp Source 04/29/23 0928 Oral     SpO2 04/29/23 0928 96 %     Weight --      Height --      Head Circumference --      Peak Flow --      Pain Score 04/29/23 0933 0     Pain Loc --      Pain Education --      Exclude from Growth Chart --    No data found.  Updated Vital Signs BP (!) 177/100 (BP Location: Right Arm) Comment: Pt states she takes blood pressure medications but her doctor are trying different types of bp meds.  Pulse 83   Temp 98.8 F (37.1 C) (Oral)   Resp 16   LMP  (LMP Unknown)   SpO2 96%   Visual Acuity Right Eye Distance:    Left Eye Distance:   Bilateral Distance:    Right Eye Near:   Left Eye Near:    Bilateral Near:     Physical Exam Vitals and nursing note reviewed.  Constitutional:      General: She is not in acute distress.    Appearance: She is not ill-appearing or diaphoretic.  HENT:     Right Ear: Tympanic membrane normal.     Left Ear: Tympanic membrane normal.     Mouth/Throat:     Mouth: Mucous membranes are moist.     Pharynx: No posterior oropharyngeal erythema.  Eyes:     Extraocular Movements: Extraocular movements intact.  Cardiovascular:     Rate and Rhythm: Normal rate and regular rhythm.     Pulses: Normal pulses.     Heart sounds: Normal heart sounds.  Pulmonary:     Effort: Pulmonary effort is normal.     Breath sounds: Normal breath sounds.  Abdominal:     General: Bowel sounds are normal.     Palpations: Abdomen is soft.  Neurological:     Mental Status: She is alert.      UC Treatments / Results  Labs (all labs ordered are listed, but only abnormal results are displayed) Labs Reviewed - No data to display  EKG   Radiology No results found.  Procedures Procedures (including critical care time)  Medications Ordered in UC Medications - No data to display  Initial Impression / Assessment and Plan / UC Course  I have reviewed the triage vital signs and the nursing notes.  Pertinent labs & imaging results that were available during my care of the patient were reviewed by me and considered in my medical decision making (see chart for details).     1.  Viral URI with cough: Humidifier and VapoRub use recommended Tessalon Perles as needed for cough Fluticasone nasal spray Tylenol or ibuprofen as needed for pain Patient is advised to take her blood pressure medications because her blood pressure is elevated today. Return precautions given. Final Clinical Impressions(s) / UC Diagnoses   Final diagnoses:  Viral URI with cough     Discharge  Instructions      Please maintain adequate hydration Your symptoms are likely secondary to a viral illness Warm salt water gargle will help with sore throat Humidifier and VapoRub use will help with symptoms  Please use medications as directed You do not need COVID or flu testing at this time Your blood pressure is elevated this morning.  Please take your blood pressure medications and check your blood pressures.  If blood pressure remains elevated please reach out to your primary care provider for medication review. If you have worsening symptoms please feel free to return to urgent care to be reevaluated.   ED Prescriptions     Medication Sig Dispense Auth. Provider   fluticasone (FLONASE) 50 MCG/ACT nasal spray Place 1 spray into both nostrils daily. 16 g Earline Stiner, Britta Mccreedy, MD   benzonatate (TESSALON) 200 MG capsule Take 1 capsule (200 mg total) by mouth every 8 (eight) hours. 21 capsule Tiffny Gemmer, Britta Mccreedy, MD      PDMP not reviewed this encounter.   Merrilee Jansky, MD 04/29/23 1140

## 2023-04-29 NOTE — Discharge Instructions (Addendum)
Please maintain adequate hydration Your symptoms are likely secondary to a viral illness Warm salt water gargle will help with sore throat Humidifier and VapoRub use will help with symptoms Please use medications as directed You do not need COVID or flu testing at this time Your blood pressure is elevated this morning.  Please take your blood pressure medications and check your blood pressures.  If blood pressure remains elevated please reach out to your primary care provider for medication review. If you have worsening symptoms please feel free to return to urgent care to be reevaluated.

## 2023-06-07 ENCOUNTER — Ambulatory Visit: Payer: Commercial Managed Care - PPO | Admitting: Internal Medicine

## 2023-06-07 VITALS — BP 168/100 | HR 89 | Temp 98.2°F | Resp 16 | Ht 63.0 in | Wt 174.0 lb

## 2023-06-07 DIAGNOSIS — Z1211 Encounter for screening for malignant neoplasm of colon: Secondary | ICD-10-CM

## 2023-06-07 DIAGNOSIS — R9389 Abnormal findings on diagnostic imaging of other specified body structures: Secondary | ICD-10-CM

## 2023-06-07 DIAGNOSIS — D649 Anemia, unspecified: Secondary | ICD-10-CM

## 2023-06-07 DIAGNOSIS — E7800 Pure hypercholesterolemia, unspecified: Secondary | ICD-10-CM | POA: Diagnosis not present

## 2023-06-07 DIAGNOSIS — I10 Essential (primary) hypertension: Secondary | ICD-10-CM | POA: Diagnosis not present

## 2023-06-07 DIAGNOSIS — Z8742 Personal history of other diseases of the female genital tract: Secondary | ICD-10-CM | POA: Diagnosis not present

## 2023-06-07 DIAGNOSIS — Z124 Encounter for screening for malignant neoplasm of cervix: Secondary | ICD-10-CM

## 2023-06-07 MED ORDER — VALSARTAN 80 MG PO TABS: 80.0000 mg | ORAL_TABLET | Freq: Every day | ORAL | 1 refills | Status: DC

## 2023-06-07 MED ORDER — AMLODIPINE BESYLATE 10 MG PO TABS: 10.0000 mg | ORAL_TABLET | Freq: Every day | ORAL | 1 refills | Status: DC

## 2023-06-07 MED ORDER — ATORVASTATIN CALCIUM 40 MG PO TABS: 40.0000 mg | ORAL_TABLET | Freq: Every day | ORAL | 1 refills | Status: DC

## 2023-06-07 NOTE — Progress Notes (Signed)
 Subjective:    Patient ID: Sydney Martinez, female    DOB: [birth year], 63 y.o.   MRN: [id number]  Patient here for  Chief Complaint  Patient presents with   Hypertension    HPI Appt to discuss her blood pressure. Relatively new pt. Was placed back on amlodipine  and was started on micardis . Felt she did not tolerate.  Changed to diovan . Evaluated 10/2022. Here for follow up. States she is not taking her blood pressure medication. Ran out. Needs pap smear. Also in June, she had reported some intermittent pelvic pain. Documented history of ovarian cyst. Pelvic ultrasound 10/29/22 - Multiple fibroids are identified. The largest measures 2.6 cm. The endometrial stripe is thickened at 12 mm which is abnormally thickened for age. Recommend gynecologic consultation and endometrial sampling. No other abnormalities.was referred to GYN. Reports today - persistent intermittent abdominal discomfort - feels like menstrual cramping. No vaginal bleeding. Discussed ultrasound and previous referral. Discussed importance of gyn appt. Agreeable. Discussed benefiber to help keep bowels moving.    Past Medical History:  Diagnosis Date   Arthritis    Asthma    GERD (gastroesophageal reflux disease)    Hyperlipidemia    Hypertension    Past Surgical History:  Procedure Laterality Date   APPENDECTOMY     Family History  Problem Relation Age of Onset   Hyperlipidemia Mother    Arthritis Mother    Hyperlipidemia Father    Hyperlipidemia Sister    Social History   Socioeconomic History   Marital status: Married    Spouse name: Not on file   Number of children: Not on file   Years of education: Not on file   Highest education level: Not on file  Occupational History   Not on file  Tobacco Use   Smoking status: Never   Smokeless tobacco: Never  Substance and Sexual Activity   Alcohol use: No    Alcohol/week: 0.0 standard drinks of alcohol   Drug use: No   Sexual activity: Yes  Other Topics  Concern   Not on file  Social History Narrative   Not on file   Social Drivers of Health   Financial Resource Strain: Not on file  Food Insecurity: Not on file  Transportation Needs: Not on file  Physical Activity: Not on file  Stress: Not on file  Social Connections: Not on file     Review of Systems  Constitutional:  Negative for appetite change and unexpected weight change.  HENT:  Negative for congestion and sinus pressure.   Respiratory:  Negative for cough, chest tightness and shortness of breath.   Cardiovascular:  Negative for chest pain, palpitations and leg swelling.  Gastrointestinal:  Negative for diarrhea, nausea and vomiting.       Abdominal discomfort as outlined.   Genitourinary:  Negative for difficulty urinating and dysuria.  Musculoskeletal:  Negative for joint swelling and myalgias.  Skin:  Negative for color change and rash.  Neurological:  Negative for dizziness and headaches.  Psychiatric/Behavioral:  Negative for agitation and dysphoric mood.        Objective:     BP (!) 168/100   Pulse 89   Temp 98.2 F (36.8 C)   Resp 16   Ht 5' 3 (1.6 m)   Wt 174 lb (78.9 kg)   LMP  (LMP Unknown)   SpO2 98%   BMI 30.82 kg/m  Wt Readings from Last 3 Encounters:  06/07/23 174 lb (78.9 kg)  11/16/22 170 lb (77.1  kg)  11/12/22 171 lb (77.6 kg)    Physical Exam Vitals reviewed.  Constitutional:      General: She is not in acute distress.    Appearance: Normal appearance.  HENT:     Head: Normocephalic and atraumatic.     Right Ear: External ear normal.     Left Ear: External ear normal.     Mouth/Throat:     Pharynx: No oropharyngeal exudate or posterior oropharyngeal erythema.  Eyes:     General: No scleral icterus.       Right eye: No discharge.        Left eye: No discharge.     Conjunctiva/sclera: Conjunctivae normal.  Neck:     Thyroid : No thyromegaly.  Cardiovascular:     Rate and Rhythm: Normal rate and regular rhythm.  Pulmonary:      Effort: No respiratory distress.     Breath sounds: Normal breath sounds. No wheezing.  Abdominal:     General: Bowel sounds are normal.     Palpations: Abdomen is soft.     Tenderness: There is no abdominal tenderness.  Musculoskeletal:        General: No swelling or tenderness.     Cervical back: Neck supple. No tenderness.  Lymphadenopathy:     Cervical: No cervical adenopathy.  Skin:    Findings: No erythema or rash.  Neurological:     Mental Status: She is alert.  Psychiatric:        Mood and Affect: Mood normal.        Behavior: Behavior normal.         Outpatient Encounter Medications as of 06/07/2023  Medication Sig   amLODipine  (NORVASC ) 10 MG tablet Take 1 tablet (10 mg total) by mouth daily.   atorvastatin  (LIPITOR) 40 MG tablet Take 1 tablet (40 mg total) by mouth daily.   cetirizine (ZYRTEC) 10 MG tablet cetirizine 10 mg tablet  TAKE 1 TABLET BY MOUTH EVERY DAY   fluticasone  (FLONASE ) 50 MCG/ACT nasal spray Place 1 spray into both nostrils daily.   pantoprazole  (PROTONIX ) 40 MG tablet TAKE 1 TABLET BY MOUTH EVERY DAY   valsartan  (DIOVAN ) 80 MG tablet Take 1 tablet (80 mg total) by mouth daily.   [DISCONTINUED] amLODipine  (NORVASC ) 10 MG tablet TAKE 1 TABLET BY MOUTH EVERY DAY   [DISCONTINUED] atorvastatin  (LIPITOR) 40 MG tablet TAKE 1 TABLET BY MOUTH EVERY DAY   [DISCONTINUED] benzonatate  (TESSALON ) 200 MG capsule Take 1 capsule (200 mg total) by mouth every 8 (eight) hours.   [DISCONTINUED] valsartan  (DIOVAN ) 80 MG tablet TAKE 1 TABLET BY MOUTH EVERY DAY   No facility-administered encounter medications on file as of 06/07/2023.     Lab Results  Component Value Date   WBC 6.6 06/07/2023   HGB 12.3 06/07/2023   HCT 38.4 06/07/2023   PLT 268.0 06/07/2023   GLUCOSE 106 (H) 06/07/2023   CHOL 260 (H) 09/23/2022   TRIG (H) 09/23/2022    502.0 Triglyceride is over 400; calculations on Lipids are invalid.   HDL 30.90 (L) 09/23/2022   LDLDIRECT 175.0 09/23/2022    ALT 22 06/07/2023   AST 31 06/07/2023   NA 139 06/07/2023   K 3.4 (L) 06/07/2023   CL 103 06/07/2023   CREATININE 0.79 06/07/2023   BUN 10 06/07/2023   CO2 29 06/07/2023   TSH 1.13 09/23/2022       Assessment & Plan:  Hypertension, unspecified type Assessment & Plan: Blood pressure as outlined.  Off medication.  Restart amlodipine  and diovan .  Check urine and metabolic panel. Follow pressures.  Discussed importance of taking her blood pressure medication. Soon follow.  Spot check pressures.  Get her back in soon to reassess.   Orders: -     Basic metabolic panel -     Urinalysis, Routine w reflex microscopic  Hypercholesteremia Assessment & Plan: Continue lipitor.  Low cholesterol diet and exercise.   Orders: -     CBC with Differential/Platelet -     Hepatic function panel  Thickened endometrium Assessment & Plan: Thickened endometrium noted on pelvic ultrasound. Persistent intermittent abdominal cramping as outlined. Discussed need for gyn evaluation. Agreeable.   Orders: -     Ambulatory referral to Gynecology  History of ovarian cyst Assessment & Plan: Had recent pelvic ultrasound.  No ovarian cyst.  Uterine fibroids present.  Also thickened endometrium.  Has been referred to gyn for further evaluation.    Colon cancer screening Assessment & Plan: Colonoscopy 10/2020 - one 4mm polyp in descending colon.  Internal hemorrhoids.  Diverticulosis.  Pathology - polypoid colonic mucosa - negative.    Cervical cancer screening Assessment & Plan: Overdue.  Being referred to gyn for evaluation of thickened endometrium. If they do not do pap, schedule pap here.    Anemia, unspecified type Assessment & Plan: Recheck cbc to confirm wnl.    Other orders -     amLODIPine  Besylate; Take 1 tablet (10 mg total) by mouth daily.  Dispense: 30 tablet; Refill: 1 -     Atorvastatin  Calcium ; Take 1 tablet (40 mg total) by mouth daily.  Dispense: 30 tablet; Refill: 1 -      Valsartan ; Take 1 tablet (80 mg total) by mouth daily.  Dispense: 30 tablet; Refill: 1     Allena Hamilton, MD

## 2023-06-08 LAB — CBC WITH DIFFERENTIAL/PLATELET
Basophils Absolute: 0.1 10*3/uL (ref 0.0–0.1)
Basophils Relative: 1.3 % (ref 0.0–3.0)
Eosinophils Absolute: 0.1 10*3/uL (ref 0.0–0.7)
Eosinophils Relative: 1.2 % (ref 0.0–5.0)
HCT: 38.4 % (ref 36.0–46.0)
Hemoglobin: 12.3 g/dL (ref 12.0–15.0)
Lymphocytes Relative: 39 % (ref 12.0–46.0)
Lymphs Abs: 2.6 10*3/uL (ref 0.7–4.0)
MCHC: 32 g/dL (ref 30.0–36.0)
MCV: 83.2 fL (ref 78.0–100.0)
Monocytes Absolute: 0.4 10*3/uL (ref 0.1–1.0)
Monocytes Relative: 6.6 % (ref 3.0–12.0)
Neutro Abs: 3.5 10*3/uL (ref 1.4–7.7)
Neutrophils Relative %: 51.9 % (ref 43.0–77.0)
Platelets: 268 10*3/uL (ref 150.0–400.0)
RBC: 4.62 Mil/uL (ref 3.87–5.11)
RDW: 14.2 % (ref 11.5–15.5)
WBC: 6.6 10*3/uL (ref 4.0–10.5)

## 2023-06-08 LAB — URINALYSIS, ROUTINE W REFLEX MICROSCOPIC
Bilirubin Urine: NEGATIVE
Hgb urine dipstick: NEGATIVE
Ketones, ur: NEGATIVE
Leukocytes,Ua: NEGATIVE
Nitrite: NEGATIVE
Specific Gravity, Urine: 1.02 (ref 1.000–1.030)
Urine Glucose: NEGATIVE
Urobilinogen, UA: 0.2 (ref 0.0–1.0)
pH: 7 (ref 5.0–8.0)

## 2023-06-08 LAB — BASIC METABOLIC PANEL
BUN: 10 mg/dL (ref 6–23)
CO2: 29 meq/L (ref 19–32)
Calcium: 9.3 mg/dL (ref 8.4–10.5)
Chloride: 103 meq/L (ref 96–112)
Creatinine, Ser: 0.79 mg/dL (ref 0.40–1.20)
GFR: 79.28 mL/min (ref >60.00)
Glucose, Bld: 106 mg/dL — ABNORMAL HIGH (ref 70–99)
Potassium: 3.4 meq/L — ABNORMAL LOW (ref 3.5–5.1)
Sodium: 139 meq/L (ref 135–145)

## 2023-06-08 LAB — HEPATIC FUNCTION PANEL
ALT: 22 U/L (ref 0–35)
AST: 31 U/L (ref 0–37)
Albumin: 4.5 g/dL (ref 3.5–5.2)
Alkaline Phosphatase: 80 U/L (ref 39–117)
Bilirubin, Direct: 0 mg/dL (ref 0.0–0.3)
Total Bilirubin: 0.3 mg/dL (ref 0.2–1.2)
Total Protein: 7.4 g/dL (ref 6.0–8.3)

## 2023-06-12 ENCOUNTER — Encounter: Payer: Self-pay | Admitting: Internal Medicine

## 2023-06-12 ENCOUNTER — Other Ambulatory Visit: Payer: Self-pay | Admitting: Internal Medicine

## 2023-06-12 DIAGNOSIS — I10 Essential (primary) hypertension: Secondary | ICD-10-CM

## 2023-06-12 DIAGNOSIS — E876 Hypokalemia: Secondary | ICD-10-CM

## 2023-06-12 NOTE — Assessment & Plan Note (Signed)
Colonoscopy 10/2020 - one 4mm polyp in descending colon.  Internal hemorrhoids.  Diverticulosis.  Pathology - polypoid colonic mucosa - negative.  

## 2023-06-12 NOTE — Assessment & Plan Note (Signed)
Had recent pelvic ultrasound.  No ovarian cyst.  Uterine fibroids present.  Also thickened endometrium.  Has been referred to gyn for further evaluation.

## 2023-06-12 NOTE — Assessment & Plan Note (Signed)
Blood pressure as outlined.  Off medication. Restart amlodipine and diovan.  Check urine and metabolic panel. Follow pressures.  Discussed importance of taking her blood pressure medication. Soon follow.  Spot check pressures.  Get her back in soon to reassess.

## 2023-06-12 NOTE — Assessment & Plan Note (Signed)
Continue lipitor.  Low cholesterol diet and exercise.   ?

## 2023-06-12 NOTE — Progress Notes (Signed)
Order placed for f/u labs.  

## 2023-06-12 NOTE — Assessment & Plan Note (Signed)
Overdue.  Being referred to gyn for evaluation of thickened endometrium. If they do not do pap, schedule pap here.

## 2023-06-12 NOTE — Assessment & Plan Note (Signed)
Recheck cbc to confirm wnl.  

## 2023-06-12 NOTE — Assessment & Plan Note (Signed)
Thickened endometrium noted on pelvic ultrasound. Persistent intermittent abdominal cramping as outlined. Discussed need for gyn evaluation. Agreeable.

## 2023-06-15 ENCOUNTER — Other Ambulatory Visit (INDEPENDENT_AMBULATORY_CARE_PROVIDER_SITE_OTHER): Payer: Commercial Managed Care - PPO

## 2023-06-15 DIAGNOSIS — E876 Hypokalemia: Secondary | ICD-10-CM | POA: Diagnosis not present

## 2023-06-15 DIAGNOSIS — I10 Essential (primary) hypertension: Secondary | ICD-10-CM

## 2023-06-16 LAB — BASIC METABOLIC PANEL
BUN: 13 mg/dL (ref 6–23)
CO2: 28 meq/L (ref 19–32)
Calcium: 9.6 mg/dL (ref 8.4–10.5)
Chloride: 102 meq/L (ref 96–112)
Creatinine, Ser: 0.87 mg/dL (ref 0.40–1.20)
GFR: 70.6 mL/min (ref >60.00)
Glucose, Bld: 106 mg/dL — ABNORMAL HIGH (ref 70–99)
Potassium: 3.5 meq/L (ref 3.5–5.1)
Sodium: 139 meq/L (ref 135–145)

## 2023-06-23 LAB — ALDOSTERONE + RENIN ACTIVITY W/ RATIO
ALDO / PRA Ratio: 7.1 {ratio} (ref 0.9–28.9)
Aldosterone: 4 ng/dL
Renin Activity: 0.56 ng/mL/h (ref 0.25–5.82)

## 2023-07-05 ENCOUNTER — Encounter: Payer: Commercial Managed Care - PPO | Admitting: Obstetrics

## 2023-07-12 ENCOUNTER — Ambulatory Visit: Payer: Commercial Managed Care - PPO | Admitting: Internal Medicine

## 2023-08-09 ENCOUNTER — Encounter: Payer: Commercial Managed Care - PPO | Admitting: Obstetrics

## 2023-08-09 NOTE — Progress Notes (Deleted)
    GYNECOLOGY PROGRESS NOTE  Subjective:  PCP: Dale [place], MD  Patient ID: Sydney Martinez, female    DOB: Feb 15, 1961, 63 y.o.   MRN: [id number]  HPI  Patient is a 63 y.o. No obstetric history on file. female who presents for   {Common ambulatory SmartLinks:19316}  Review of Systems {ros; complete:30496}   Objective:   There were no vitals taken for this visit. There is no height or weight on file to calculate BMI.  General appearance: {general exam:16600} Abdomen: {abdominal exam:16834} Pelvic: {pelvic exam:16852::"cervix normal in appearance","external genitalia normal","no adnexal masses or tenderness","no cervical motion tenderness","rectovaginal septum normal","uterus normal size, shape, and consistency","vagina normal without discharge"} Extremities: {extremity exam:5109} Neurologic: {neuro exam:17854}   Assessment/Plan:   No diagnosis found.   There are no diagnoses linked to this encounter.     Julieanne Manson, DO [facility]

## 2023-08-09 NOTE — Progress Notes (Unsigned)
    GYNECOLOGY PROGRESS NOTE  Subjective:  PCP: Dale [place], MD  Patient ID: Sydney Martinez, female    DOB: 11/14/1960, 63 y.o.   MRN: [id number]  HPI  Patient is a 63 y.o. No obstetric history on file. female who presents for referral from Dale  MD for . Thickened endometrium on pelvic ultrasound. Pt with intermittent pelvic pain.  ultrasound was on 10/30/22 showing:  Multiple fibroids are identified. The largest measures 2.6 cm. 2. The endometrial stripe is thickened at 12 mm which is abnormally thickened for age. Recommend gynecologic consultation and endometrial sampling. 3. No other abnormalities.    {Common ambulatory SmartLinks:19316}  Review of Systems {ros; complete:30496}   Objective:   There were no vitals taken for this visit. There is no height or weight on file to calculate BMI.  General appearance: {general exam:16600} Abdomen: {abdominal exam:16834} Pelvic: {pelvic exam:16852::"cervix normal in appearance","external genitalia normal","no adnexal masses or tenderness","no cervical motion tenderness","rectovaginal septum normal","uterus normal size, shape, and consistency","vagina normal without discharge"} Extremities: {extremity exam:5109} Neurologic: {neuro exam:17854}   Assessment/Plan:   No diagnosis found.   There are no diagnoses linked to this encounter.     Julieanne Manson, DO [facility]

## 2023-08-10 ENCOUNTER — Other Ambulatory Visit (HOSPITAL_COMMUNITY)
Admission: RE | Admit: 2023-08-10 | Discharge: 2023-08-10 | Disposition: A | Discharge status: Home / Self Care | Source: Ambulatory Visit | Attending: Obstetrics | Admitting: Obstetrics

## 2023-08-10 ENCOUNTER — Ambulatory Visit: Admitting: Obstetrics

## 2023-08-10 ENCOUNTER — Encounter: Payer: Self-pay | Admitting: Obstetrics

## 2023-08-10 VITALS — BP 166/87 | HR 83 | Ht 63.0 in | Wt 176.0 lb

## 2023-08-10 DIAGNOSIS — R9389 Abnormal findings on diagnostic imaging of other specified body structures: Secondary | ICD-10-CM | POA: Insufficient documentation

## 2023-08-10 DIAGNOSIS — N898 Other specified noninflammatory disorders of vagina: Secondary | ICD-10-CM

## 2023-08-10 DIAGNOSIS — D259 Leiomyoma of uterus, unspecified: Secondary | ICD-10-CM | POA: Diagnosis not present

## 2023-08-10 DIAGNOSIS — D219 Benign neoplasm of connective and other soft tissue, unspecified: Secondary | ICD-10-CM | POA: Insufficient documentation

## 2023-08-12 ENCOUNTER — Emergency Department

## 2023-08-12 ENCOUNTER — Other Ambulatory Visit: Payer: Self-pay

## 2023-08-12 ENCOUNTER — Emergency Department
Admission: EM | Admit: 2023-08-12 | Discharge: 2023-08-13 | Disposition: A | Discharge status: Home / Self Care | Attending: Emergency Medicine | Admitting: Emergency Medicine

## 2023-08-12 DIAGNOSIS — Z7951 Long term (current) use of inhaled steroids: Secondary | ICD-10-CM | POA: Diagnosis not present

## 2023-08-12 DIAGNOSIS — J45909 Unspecified asthma, uncomplicated: Secondary | ICD-10-CM | POA: Insufficient documentation

## 2023-08-12 DIAGNOSIS — Z79899 Other long term (current) drug therapy: Secondary | ICD-10-CM | POA: Diagnosis not present

## 2023-08-12 DIAGNOSIS — R519 Headache, unspecified: Secondary | ICD-10-CM | POA: Diagnosis present

## 2023-08-12 DIAGNOSIS — I10 Essential (primary) hypertension: Secondary | ICD-10-CM | POA: Diagnosis not present

## 2023-08-12 LAB — SURGICAL PATHOLOGY

## 2023-08-12 NOTE — ED Notes (Signed)
 Pt reports her Doctor adjusted her BP medicine as her BP runs high systolic in the 180-190's.

## 2023-08-12 NOTE — ED Provider Notes (Signed)
 [name] Provider Note    Event Date/Time   First MD Initiated Contact with Patient 08/12/23 2336     (approximate)   History   Hypertension   HPI  Sydney Martinez is a 63 y.o. female with history of hypertension, hyperlipidemia, asthma who presents to the emergency department with intermittent left-sided headaches and high blood pressure.  She states that her headache has improved but she was concerned that her blood pressure continued to be high.  She is on amlodipine 10 mg daily and valsartan 80 mg daily.  States she just had her medications adjusted and plans to see her doctor again next week.  She denies any vision changes, numbness, tingling, weakness, head injury.  Not on blood thinners.  No chest pain or shortness of breath.   History provided by patient, husband.    Past Medical History:  Diagnosis Date   Arthritis    Asthma    GERD (gastroesophageal reflux disease)    Hyperlipidemia    Hypertension     Past Surgical History:  Procedure Laterality Date   APPENDECTOMY      MEDICATIONS:  Prior to Admission medications   Medication Sig Start Date End Date Taking? Authorizing Provider  amLODipine (NORVASC) 10 MG tablet Take 1 tablet (10 mg total) by mouth daily. 06/07/23   Dale [place], MD  atorvastatin (LIPITOR) 40 MG tablet Take 1 tablet (40 mg total) by mouth daily. 06/07/23   Dale [place], MD  cetirizine (ZYRTEC) 10 MG tablet cetirizine 10 mg tablet  TAKE 1 TABLET BY MOUTH EVERY DAY    [provider]  fluticasone (FLONASE) 50 MCG/ACT nasal spray Place 1 spray into both nostrils daily. 04/29/23   Merrilee Jansky, MD  pantoprazole (PROTONIX) 40 MG tablet TAKE 1 TABLET BY MOUTH EVERY DAY 10/28/22   Dale [place], MD  valsartan (DIOVAN) 80 MG tablet Take 1 tablet (80 mg total) by mouth daily. 06/07/23   Dale [place], MD    Physical Exam   Triage Vital Signs: ED Triage Vitals  Encounter Vitals Group     BP  08/12/23 2239 (!) 193/88     Systolic BP Percentile --      Diastolic BP Percentile --      Pulse Rate 08/12/23 2239 77     Resp 08/12/23 2239 16     Temp 08/12/23 2239 98.3 F (36.8 C)     Temp Source 08/12/23 2239 Oral     SpO2 08/12/23 2239 97 %     Weight 08/12/23 2241 176 lb (79.8 kg)     Height 08/12/23 2241 5\' 3"  (1.6 m)     Head Circumference --      Peak Flow --      Pain Score 08/12/23 2238 3     Pain Loc --      Pain Education --      Exclude from Growth Chart --     Most recent vital signs: Vitals:   08/13/23 0023 08/13/23 0200  BP:  (!) 145/82  Pulse: 63 (!) 59  Resp: (!) 23 18  Temp:    SpO2: 94% 100%    CONSTITUTIONAL: Alert, responds appropriately to questions. Well-appearing; well-nourished HEAD: Normocephalic, atraumatic EYES: Conjunctivae clear, pupils appear equal, sclera nonicteric ENT: normal nose; moist mucous membranes NECK: Supple, normal ROM CARD: RRR; S1 and S2 appreciated RESP: Normal chest excursion without splinting or tachypnea; breath sounds clear and equal bilaterally; no wheezes, no rhonchi, no rales, no hypoxia  or respiratory distress, speaking full sentences ABD/GI: Non-distended; soft, non-tender, no rebound, no guarding, no peritoneal signs BACK: The back appears normal EXT: Normal ROM in all joints; no deformity noted, no edema, no calf tenderness or calf swelling SKIN: Normal color for age and race; warm; no rash on exposed skin NEURO: Moves all extremities equally, normal speech, no facial asymmetry, normal sensation, normal gait PSYCH: The patient's mood and manner are appropriate.   ED Results / Procedures / Treatments   LABS: (all labs ordered are listed, but only abnormal results are displayed) Labs Reviewed  COMPREHENSIVE METABOLIC PANEL - Abnormal; Notable for the following components:      Result Value   Glucose, Bld 173 (*)    All other components within normal limits  URINALYSIS, ROUTINE W REFLEX MICROSCOPIC -  Abnormal; Notable for the following components:   Color, Urine STRAW (*)    APPearance CLEAR (*)    All other components within normal limits  CBC WITH DIFFERENTIAL/PLATELET  TROPONIN I (HIGH SENSITIVITY)     EKG:    Date: 08/13/2023  Rate: 60  Rhythm: normal sinus rhythm  QRS Axis: normal  Intervals: normal  ST/T Wave abnormalities: normal  Conduction Disutrbances: none  Narrative Interpretation: unremarkable     RADIOLOGY: My personal review and interpretation of imaging: Head CT negative.  I have personally reviewed all radiology reports.   CT HEAD WO CONTRAST ( ) Result Date: 08/12/2023 CLINICAL DATA:  Headache, sudden, severe Pt reports "feels like a pinch" Pt talks in complete sentences no respiratory distress noted. Pt reports prior to arrival she took a Norvasc EXAM: CT HEAD WITHOUT CONTRAST TECHNIQUE: Contiguous axial images were obtained from the base of the skull through the vertex without intravenous contrast. RADIATION DOSE REDUCTION: This exam was performed according to the departmental dose-optimization program which includes automated exposure control, adjustment of the mA and/or kV according to patient size and/or use of iterative reconstruction technique. COMPARISON:  MRI head 11/16/2022, CT head 11/12/2022 FINDINGS: Brain: Chronic left frontal, parietal, occipital encephalomalacia. Patchy and confluent areas of decreased attenuation are noted throughout the deep and periventricular white matter of the cerebral hemispheres bilaterally, compatible with chronic microvascular ischemic disease. No evidence of large-territorial acute infarction. No parenchymal hemorrhage. No mass lesion. No extra-axial collection. No mass effect or midline shift. No hydrocephalus. Basilar cisterns are patent. Vascular: No hyperdense vessel. Skull: No acute fracture or focal lesion. Sinuses/Orbits: Paranasal sinuses and mastoid air cells are clear. The orbits are unremarkable. Other: None.  IMPRESSION: No acute intracranial abnormality. Electronically Signed   By: Tish Frederickson M.D.   On: 08/12/2023 23:44     PROCEDURES:  Critical Care performed: No     .1-3 Lead EKG Interpretation  Performed by: Genelle Economou, Layla Maw, DO Authorized by: Umaiza Matusik, Layla Maw, DO     Interpretation: normal     ECG rate:  60   ECG rate assessment: normal     Rhythm: sinus rhythm     Ectopy: none     Conduction: normal       IMPRESSION / MDM / ASSESSMENT AND PLAN / ED COURSE  I reviewed the triage vital signs and the nursing notes.    Patient here with hypertension, headache.  The patient is on the cardiac monitor to evaluate for evidence of arrhythmia and/or significant heart rate changes.   DIFFERENTIAL DIAGNOSIS (includes but not limited to):   Hypertensive urgency, hypertensive emergency, intracranial hemorrhage, hypertensive headache, tension headache, doubt meningitis, CVT, stroke  Patient's  presentation is most consistent with acute presentation with potential threat to life or bodily function.   PLAN: Will obtain labs, urine, head CT to evaluate for signs of endorgan damage.  Will give Toradol for headache and monitor blood pressure closely.   MEDICATIONS GIVEN IN ED: Medications  ketorolac (TORADOL) 30 MG/ML injection 30 mg (30 mg Intravenous Given 08/13/23 0147)     ED COURSE: Blood pressure already improved to the 150s/80s even prior to getting any medications in the ED.  Labs show normal hemoglobin, electrolytes, renal function.  Urine shows no proteinuria or hematuria.  Head CT unremarkable and reviewed/interpreted by myself and the radiologist.  Blood pressure is now in the 130s/70s.  She reports feeling much better.  Will have her follow-up with her PCP as scheduled next week.  Recommended she continue the medication she is on currently.  Do not recommend any changes at this time given blood pressure has improved significantly without intervention.  Recommended she take  Tylenol, Motrin over-the-counter as needed if her headache returns.  Discussed return precautions with patient and husband.   At this time, I do not feel there is any life-threatening condition present. I reviewed all nursing notes, vitals, pertinent previous records.  All lab and urine results, EKGs, imaging ordered have been independently reviewed and interpreted by myself.  I reviewed all available radiology reports from any imaging ordered this visit.  Based on my assessment, I feel the patient is safe to be discharged home without further emergent workup and can continue workup as an outpatient as needed. Discussed all findings, treatment plan as well as usual and customary return precautions.  They verbalize understanding and are comfortable with this plan.  Outpatient follow-up has been provided as needed.  All questions have been answered.    CONSULTS:  none   OUTSIDE RECORDS REVIEWED: Reviewed last PCP note on 06/07/2023.       FINAL CLINICAL IMPRESSION(S) / ED DIAGNOSES   Final diagnoses:  Uncontrolled hypertension     Rx / DC Orders   ED Discharge Orders     None        Note:  This document was prepared using Dragon voice recognition software and may include unintentional dictation errors.   Denna Fryberger, Layla Maw, [place] 08/13/23 [phone]

## 2023-08-12 NOTE — ED Triage Notes (Signed)
 Pt reports she was lying on bed when she started to feel hot and developed a pain to left forehead. Pt reports "feels like a pinch" Pt talks in complete sentences no respiratory distress noted. Pt reports prior to arrival she took a Norvasc 10mg  as she checked her BP at home and was elevated 223/107. Pt talks in complete sentences no respiratory distress noted

## 2023-08-13 ENCOUNTER — Other Ambulatory Visit: Payer: Self-pay

## 2023-08-13 LAB — CBC WITH DIFFERENTIAL/PLATELET
Abs Immature Granulocytes: 0.02 10*3/uL (ref 0.00–0.07)
Basophils Absolute: 0.1 10*3/uL (ref 0.0–0.1)
Basophils Relative: 1 %
Eosinophils Absolute: 0.1 10*3/uL (ref 0.0–0.5)
Eosinophils Relative: 1 %
HCT: 38.3 % (ref 36.0–46.0)
Hemoglobin: 12.2 g/dL (ref 12.0–15.0)
Immature Granulocytes: 0 %
Lymphocytes Relative: 39 %
Lymphs Abs: 2.9 10*3/uL (ref 0.7–4.0)
MCH: 26.9 pg (ref 26.0–34.0)
MCHC: 31.9 g/dL (ref 30.0–36.0)
MCV: 84.4 fL (ref 80.0–100.0)
Monocytes Absolute: 0.6 10*3/uL (ref 0.1–1.0)
Monocytes Relative: 8 %
Neutro Abs: 3.8 10*3/uL (ref 1.7–7.7)
Neutrophils Relative %: 51 %
Platelets: 284 10*3/uL (ref 150–400)
RBC: 4.54 MIL/uL (ref 3.87–5.11)
RDW: 13.3 % (ref 11.5–15.5)
WBC: 7.4 10*3/uL (ref 4.0–10.5)
nRBC: 0 % (ref 0.0–0.2)

## 2023-08-13 LAB — COMPREHENSIVE METABOLIC PANEL
ALT: 30 U/L (ref 0–44)
AST: 39 U/L (ref 15–41)
Albumin: 4.4 g/dL (ref 3.5–5.0)
Alkaline Phosphatase: 99 U/L (ref 38–126)
Anion gap: 7 (ref 5–15)
BUN: 13 mg/dL (ref 8–23)
CO2: 24 mmol/L (ref 22–32)
Calcium: 9.4 mg/dL (ref 8.9–10.3)
Chloride: 106 mmol/L (ref 98–111)
Creatinine, Ser: 0.85 mg/dL (ref 0.44–1.00)
GFR, Estimated: >60 mL/min (ref >60)
Glucose, Bld: 173 mg/dL — ABNORMAL HIGH (ref 70–99)
Potassium: 3.5 mmol/L (ref 3.5–5.1)
Sodium: 137 mmol/L (ref 135–145)
Total Bilirubin: 0.5 mg/dL (ref 0.0–1.2)
Total Protein: 7.6 g/dL (ref 6.5–8.1)

## 2023-08-13 LAB — URINALYSIS, ROUTINE W REFLEX MICROSCOPIC
Bilirubin Urine: NEGATIVE
Glucose, UA: NEGATIVE mg/dL
Hgb urine dipstick: NEGATIVE
Ketones, ur: NEGATIVE mg/dL
Leukocytes,Ua: NEGATIVE
Nitrite: NEGATIVE
Protein, ur: NEGATIVE mg/dL
Specific Gravity, Urine: 1.009 (ref 1.005–1.030)
pH: 7 (ref 5.0–8.0)

## 2023-08-13 LAB — TROPONIN I (HIGH SENSITIVITY): Troponin I (High Sensitivity): 7 ng/L (ref <18)

## 2023-08-13 MED ORDER — KETOROLAC TROMETHAMINE 30 MG/ML IJ SOLN
30.0000 mg | Freq: Once | INTRAMUSCULAR | Status: AC
  Administered 2023-08-13: 30 mg via INTRAVENOUS
  Filled 2023-08-13: qty 1

## 2023-08-13 NOTE — Discharge Instructions (Addendum)
You may alternate Tylenol 1000 mg every 6 hours as needed for pain, fever and Ibuprofen 800 mg every 6-8 hours as needed for pain, fever.  Please take Ibuprofen with food.  Do not take more than 4000 mg of Tylenol (acetaminophen) in a 24 hour period. ° °

## 2023-08-16 ENCOUNTER — Other Ambulatory Visit: Payer: Self-pay | Admitting: Internal Medicine

## 2023-08-16 ENCOUNTER — Ambulatory Visit: Payer: Commercial Managed Care - PPO | Admitting: Internal Medicine

## 2023-08-16 ENCOUNTER — Encounter: Payer: Self-pay | Admitting: Obstetrics

## 2023-08-16 ENCOUNTER — Encounter: Payer: Self-pay | Admitting: Internal Medicine

## 2023-08-16 VITALS — BP 144/78 | HR 83 | Temp 98.2°F | Resp 16 | Ht 63.0 in | Wt 175.4 lb

## 2023-08-16 DIAGNOSIS — R519 Headache, unspecified: Secondary | ICD-10-CM

## 2023-08-16 DIAGNOSIS — E7800 Pure hypercholesterolemia, unspecified: Secondary | ICD-10-CM | POA: Diagnosis not present

## 2023-08-16 DIAGNOSIS — K219 Gastro-esophageal reflux disease without esophagitis: Secondary | ICD-10-CM

## 2023-08-16 DIAGNOSIS — R9389 Abnormal findings on diagnostic imaging of other specified body structures: Secondary | ICD-10-CM

## 2023-08-16 DIAGNOSIS — I10 Essential (primary) hypertension: Secondary | ICD-10-CM

## 2023-08-16 DIAGNOSIS — Z8742 Personal history of other diseases of the female genital tract: Secondary | ICD-10-CM

## 2023-08-16 MED ORDER — PANTOPRAZOLE SODIUM 40 MG PO TBEC: 40.0000 mg | DELAYED_RELEASE_TABLET | Freq: Every day | ORAL | 3 refills | Status: AC

## 2023-08-16 MED ORDER — AMLODIPINE BESYLATE 10 MG PO TABS: 10.0000 mg | ORAL_TABLET | Freq: Every day | ORAL | 1 refills | Status: DC

## 2023-08-16 MED ORDER — VALSARTAN 160 MG PO TABS: 160.0000 mg | ORAL_TABLET | Freq: Every day | ORAL | 3 refills | Status: DC

## 2023-08-16 MED ORDER — ATORVASTATIN CALCIUM 40 MG PO TABS: 40.0000 mg | ORAL_TABLET | Freq: Every day | ORAL | 1 refills | Status: DC

## 2023-08-16 NOTE — Patient Instructions (Signed)
 Take amlodipine 10mg  - one tablet in the evening  Increase diovan to 160mg  per day.

## 2023-08-16 NOTE — Telephone Encounter (Signed)
 Please refuse. Sent in at her appt today

## 2023-08-16 NOTE — Progress Notes (Unsigned)
 Subjective:    Patient ID: Sydney Martinez, female    DOB: 1961-02-18, 63 y.o.   MRN: [id number]  Patient here for  Chief Complaint  Patient presents with   Medical Management of Chronic Issues    HPI Here for a scheduled follow up - f/u regarding elevated blood pressure. Documented history of ovarian cyst. Pelvic ultrasound 10/29/22 - Multiple fibroids are identified. The largest measures 2.6 cm. The endometrial stripe is thickened at 12 mm which is abnormally thickened for age. Recommend gynecologic consultation and endometrial sampling. No other abnormalities.was referred to GYN. Appt with gyn 08/10/23 - EMB. Seen in ER 08/12/23 - elevated blood pressure and hadache. CT unremarkable. Blood pressure improved without intervention.    Past Medical History:  Diagnosis Date   Arthritis    Asthma    GERD (gastroesophageal reflux disease)    Hyperlipidemia    Hypertension    Past Surgical History:  Procedure Laterality Date   APPENDECTOMY     Family History  Problem Relation Age of Onset   Hyperlipidemia Mother    Arthritis Mother    Hyperlipidemia Father    Hyperlipidemia Sister    Social History   Socioeconomic History   Marital status: Married    Spouse name: Not on file   Number of children: Not on file   Years of education: Not on file   Highest education level: Not on file  Occupational History   Not on file  Tobacco Use   Smoking status: Never   Smokeless tobacco: Never  Vaping Use   Vaping status: Never Used  Substance and Sexual Activity   Alcohol use: No    Alcohol/week: 0.0 standard drinks of alcohol   Drug use: No   Sexual activity: Yes  Other Topics Concern   Not on file  Social History Narrative   Not on file   Social Drivers of Health   Financial Resource Strain: Not on file  Food Insecurity: Not on file  Transportation Needs: Not on file  Physical Activity: Not on file  Stress: Not on file  Social Connections: Not on file     Review of  Systems     Objective:     BP (!) 148/94   Pulse 83   Temp 98.2 F (36.8 C)   Resp 16   Ht 5\' 3"  (1.6 m)   Wt 175 lb 6.4 oz (79.6 kg)   LMP  (LMP Unknown)   SpO2 98%   BMI 31.07 kg/m  Wt Readings from Last 3 Encounters:  08/16/23 175 lb 6.4 oz (79.6 kg)  08/12/23 176 lb (79.8 kg)  08/10/23 176 lb (79.8 kg)    Physical Exam  {Perform Simple Foot Exam  Perform Detailed exam:1} {Insert foot Exam (Optional):30965}   Outpatient Encounter Medications as of 08/16/2023  Medication Sig   cetirizine (ZYRTEC) 10 MG tablet cetirizine 10 mg tablet  TAKE 1 TABLET BY MOUTH EVERY DAY   fluticasone (FLONASE) 50 MCG/ACT nasal spray Place 1 spray into both nostrils daily.   valsartan (DIOVAN) 160 MG tablet Take 1 tablet (160 mg total) by mouth daily.   [DISCONTINUED] valsartan (DIOVAN) 80 MG tablet Take 1 tablet (80 mg total) by mouth daily.   amLODipine (NORVASC) 10 MG tablet Take 1 tablet (10 mg total) by mouth daily. Take in the evening.   atorvastatin (LIPITOR) 40 MG tablet Take 1 tablet (40 mg total) by mouth daily.   pantoprazole (PROTONIX) 40 MG tablet Take 1 tablet (40 mg  total) by mouth daily.   [DISCONTINUED] amLODipine (NORVASC) 10 MG tablet Take 1 tablet (10 mg total) by mouth daily.   [DISCONTINUED] atorvastatin (LIPITOR) 40 MG tablet Take 1 tablet (40 mg total) by mouth daily.   [DISCONTINUED] pantoprazole (PROTONIX) 40 MG tablet TAKE 1 TABLET BY MOUTH EVERY DAY   No facility-administered encounter medications on file as of 08/16/2023.     Lab Results  Component Value Date   WBC 7.4 08/13/2023   HGB 12.2 08/13/2023   HCT 38.3 08/13/2023   PLT 284 08/13/2023   GLUCOSE 173 (H) 08/13/2023   CHOL 260 (H) 09/23/2022   TRIG (H) 09/23/2022    502.0 Triglyceride is over 400; calculations on Lipids are invalid.   HDL 30.90 (L) 09/23/2022   LDLDIRECT 175.0 09/23/2022   ALT 30 08/13/2023   AST 39 08/13/2023   NA 137 08/13/2023   K 3.5 08/13/2023   CL 106 08/13/2023    CREATININE 0.85 08/13/2023   BUN 13 08/13/2023   CO2 24 08/13/2023   TSH 1.13 09/23/2022    CT HEAD WO CONTRAST ( ) Result Date: 08/12/2023 CLINICAL DATA:  Headache, sudden, severe Pt reports "feels like a pinch" Pt talks in complete sentences no respiratory distress noted. Pt reports prior to arrival she took a Norvasc EXAM: CT HEAD WITHOUT CONTRAST TECHNIQUE: Contiguous axial images were obtained from the base of the skull through the vertex without intravenous contrast. RADIATION DOSE REDUCTION: This exam was performed according to the departmental dose-optimization program which includes automated exposure control, adjustment of the mA and/or kV according to patient size and/or use of iterative reconstruction technique. COMPARISON:  MRI head 11/16/2022, CT head 11/12/2022 FINDINGS: Brain: Chronic left frontal, parietal, occipital encephalomalacia. Patchy and confluent areas of decreased attenuation are noted throughout the deep and periventricular white matter of the cerebral hemispheres bilaterally, compatible with chronic microvascular ischemic disease. No evidence of large-territorial acute infarction. No parenchymal hemorrhage. No mass lesion. No extra-axial collection. No mass effect or midline shift. No hydrocephalus. Basilar cisterns are patent. Vascular: No hyperdense vessel. Skull: No acute fracture or focal lesion. Sinuses/Orbits: Paranasal sinuses and mastoid air cells are clear. The orbits are unremarkable. Other: None. IMPRESSION: No acute intracranial abnormality. Electronically Signed   By: Tish Frederickson M.D.   On: 08/12/2023 23:44       Assessment & Plan:  Hypercholesteremia -     TSH; Future -     Hepatic function panel; Future -     CBC with Differential/Platelet; Future -     Lipid panel; Future  Hypertension, unspecified type -     Basic metabolic panel; Future  Other orders -     amLODIPine Besylate; Take 1 tablet (10 mg total) by mouth daily. Take in the evening.   Dispense: 30 tablet; Refill: 1 -     Atorvastatin Calcium; Take 1 tablet (40 mg total) by mouth daily.  Dispense: 30 tablet; Refill: 1 -     Pantoprazole Sodium; Take 1 tablet (40 mg total) by mouth daily.  Dispense: 90 tablet; Refill: 3 -     Valsartan; Take 1 tablet (160 mg total) by mouth daily.  Dispense: 30 tablet; Refill: 3     Dale Carlos, MD

## 2023-08-17 ENCOUNTER — Inpatient Hospital Stay
Admission: RE | Admit: 2023-08-17 | Discharge: 2023-08-17 | Disposition: A | Discharge status: Home / Self Care | Payer: Self-pay | Source: Ambulatory Visit | Attending: Internal Medicine | Admitting: Internal Medicine

## 2023-08-17 ENCOUNTER — Other Ambulatory Visit: Payer: Self-pay | Admitting: *Deleted

## 2023-08-17 DIAGNOSIS — Z1231 Encounter for screening mammogram for malignant neoplasm of breast: Secondary | ICD-10-CM

## 2023-08-19 ENCOUNTER — Encounter: Payer: Self-pay | Admitting: Internal Medicine

## 2023-08-19 NOTE — Assessment & Plan Note (Signed)
 Documented history of ovarian cyst. Pelvic ultrasound 10/29/22 - Multiple fibroids are identified. The largest measures 2.6 cm. The endometrial stripe is thickened at 12 mm which is abnormally thickened for age. Recommend gynecologic consultation and endometrial sampling. No other abnormalities.was referred to GYN. Appt with gyn 08/10/23 - EMB. Biopsy negative.

## 2023-08-19 NOTE — Assessment & Plan Note (Signed)
 No headache now. Recent ER evaluation as outlined.

## 2023-08-19 NOTE — Assessment & Plan Note (Signed)
Continue lipitor.  Low cholesterol diet and exercise.  Follow lipid panel.

## 2023-08-19 NOTE — Assessment & Plan Note (Signed)
 Continues on protonix. Upper symptoms controlled. Follow.

## 2023-08-19 NOTE — Assessment & Plan Note (Signed)
 Blood pressure as outlined.  Recheck 144/78. Continues on amlodipine. Will change amlodipine back to 10mg  q day. Increase diovan to 160mg  q day. Follow pressures.  Follow metabolic panel.

## 2023-08-22 ENCOUNTER — Ambulatory Visit: Payer: Commercial Managed Care - PPO | Admitting: Internal Medicine

## 2023-08-26 ENCOUNTER — Ambulatory Visit
Admission: RE | Admit: 2023-08-26 | Discharge: 2023-08-26 | Disposition: A | Discharge status: Home / Self Care | Source: Ambulatory Visit | Attending: Internal Medicine | Admitting: Internal Medicine

## 2023-08-26 DIAGNOSIS — Z1231 Encounter for screening mammogram for malignant neoplasm of breast: Secondary | ICD-10-CM | POA: Insufficient documentation

## 2023-08-31 ENCOUNTER — Other Ambulatory Visit: Payer: Self-pay | Admitting: Internal Medicine

## 2023-08-31 DIAGNOSIS — R928 Other abnormal and inconclusive findings on diagnostic imaging of breast: Secondary | ICD-10-CM

## 2023-08-31 NOTE — Progress Notes (Signed)
Order placed for f/u left breast mammogram and ultrasound.   

## 2023-09-01 ENCOUNTER — Ambulatory Visit
Admission: RE | Admit: 2023-09-01 | Discharge: 2023-09-01 | Disposition: A | Discharge status: Home / Self Care | Source: Ambulatory Visit | Attending: Internal Medicine | Admitting: Internal Medicine

## 2023-09-01 DIAGNOSIS — R928 Other abnormal and inconclusive findings on diagnostic imaging of breast: Secondary | ICD-10-CM

## 2023-09-06 ENCOUNTER — Other Ambulatory Visit

## 2023-09-07 ENCOUNTER — Other Ambulatory Visit (INDEPENDENT_AMBULATORY_CARE_PROVIDER_SITE_OTHER)

## 2023-09-07 DIAGNOSIS — I10 Essential (primary) hypertension: Secondary | ICD-10-CM | POA: Diagnosis not present

## 2023-09-07 DIAGNOSIS — E7800 Pure hypercholesterolemia, unspecified: Secondary | ICD-10-CM | POA: Diagnosis not present

## 2023-09-07 LAB — CBC WITH DIFFERENTIAL/PLATELET
Basophils Absolute: 0.1 10*3/uL (ref 0.0–0.1)
Basophils Relative: 0.9 % (ref 0.0–3.0)
Eosinophils Absolute: 0.1 10*3/uL (ref 0.0–0.7)
Eosinophils Relative: 1 % (ref 0.0–5.0)
HCT: 39.1 % (ref 36.0–46.0)
Hemoglobin: 12.9 g/dL (ref 12.0–15.0)
Lymphocytes Relative: 40 % (ref 12.0–46.0)
Lymphs Abs: 2.6 10*3/uL (ref 0.7–4.0)
MCHC: 33 g/dL (ref 30.0–36.0)
MCV: 82.1 fl (ref 78.0–100.0)
Monocytes Absolute: 0.4 10*3/uL (ref 0.1–1.0)
Monocytes Relative: 6 % (ref 3.0–12.0)
Neutro Abs: 3.4 10*3/uL (ref 1.4–7.7)
Neutrophils Relative %: 52.1 % (ref 43.0–77.0)
Platelets: 296 10*3/uL (ref 150.0–400.0)
RBC: 4.76 Mil/uL (ref 3.87–5.11)
RDW: 13.5 % (ref 11.5–15.5)
WBC: 6.6 10*3/uL (ref 4.0–10.5)

## 2023-09-07 LAB — LIPID PANEL
Cholesterol: 201 mg/dL — ABNORMAL HIGH (ref 0–200)
HDL: 31.1 mg/dL — ABNORMAL LOW (ref >39.00)
LDL Cholesterol: 129 mg/dL — ABNORMAL HIGH (ref 0–99)
NonHDL: 169.46
Total CHOL/HDL Ratio: 6
Triglycerides: 201 mg/dL — ABNORMAL HIGH (ref 0.0–149.0)
VLDL: 40.2 mg/dL — ABNORMAL HIGH (ref 0.0–40.0)

## 2023-09-07 LAB — HEPATIC FUNCTION PANEL
ALT: 39 U/L — ABNORMAL HIGH (ref 0–35)
AST: 42 U/L — ABNORMAL HIGH (ref 0–37)
Albumin: 4.8 g/dL (ref 3.5–5.2)
Alkaline Phosphatase: 103 U/L (ref 39–117)
Bilirubin, Direct: 0.1 mg/dL (ref 0.0–0.3)
Total Bilirubin: 0.4 mg/dL (ref 0.2–1.2)
Total Protein: 7.8 g/dL (ref 6.0–8.3)

## 2023-09-07 LAB — BASIC METABOLIC PANEL WITH GFR
BUN: 14 mg/dL (ref 6–23)
CO2: 27 meq/L (ref 19–32)
Calcium: 9.9 mg/dL (ref 8.4–10.5)
Chloride: 103 meq/L (ref 96–112)
Creatinine, Ser: 1.02 mg/dL (ref 0.40–1.20)
GFR: 58.24 mL/min — ABNORMAL LOW (ref >60.00)
Glucose, Bld: 115 mg/dL — ABNORMAL HIGH (ref 70–99)
Potassium: 3.7 meq/L (ref 3.5–5.1)
Sodium: 139 meq/L (ref 135–145)

## 2023-09-07 LAB — TSH: TSH: 2.66 u[IU]/mL (ref 0.35–5.50)

## 2023-09-13 ENCOUNTER — Ambulatory Visit: Admitting: Internal Medicine

## 2023-09-13 ENCOUNTER — Encounter: Payer: Self-pay | Admitting: Internal Medicine

## 2023-09-13 VITALS — BP 144/86 | HR 95 | Resp 16 | Ht 63.0 in | Wt 174.6 lb

## 2023-09-13 DIAGNOSIS — Z124 Encounter for screening for malignant neoplasm of cervix: Secondary | ICD-10-CM

## 2023-09-13 DIAGNOSIS — K219 Gastro-esophageal reflux disease without esophagitis: Secondary | ICD-10-CM

## 2023-09-13 DIAGNOSIS — Z713 Dietary counseling and surveillance: Secondary | ICD-10-CM | POA: Insufficient documentation

## 2023-09-13 DIAGNOSIS — I10 Essential (primary) hypertension: Secondary | ICD-10-CM

## 2023-09-13 DIAGNOSIS — R9389 Abnormal findings on diagnostic imaging of other specified body structures: Secondary | ICD-10-CM

## 2023-09-13 DIAGNOSIS — R944 Abnormal results of kidney function studies: Secondary | ICD-10-CM | POA: Diagnosis not present

## 2023-09-13 DIAGNOSIS — E7800 Pure hypercholesterolemia, unspecified: Secondary | ICD-10-CM

## 2023-09-13 LAB — BASIC METABOLIC PANEL WITH GFR
BUN: 11 mg/dL (ref 6–23)
CO2: 29 meq/L (ref 19–32)
Calcium: 9.9 mg/dL (ref 8.4–10.5)
Chloride: 102 meq/L (ref 96–112)
Creatinine, Ser: 0.93 mg/dL (ref 0.40–1.20)
GFR: 65.06 mL/min (ref >60.00)
Glucose, Bld: 105 mg/dL — ABNORMAL HIGH (ref 70–99)
Potassium: 4.1 meq/L (ref 3.5–5.1)
Sodium: 140 meq/L (ref 135–145)

## 2023-09-13 MED ORDER — ATORVASTATIN CALCIUM 40 MG PO TABS: 40.0000 mg | ORAL_TABLET | Freq: Every day | ORAL | 1 refills | Status: AC

## 2023-09-13 NOTE — Assessment & Plan Note (Signed)
 Noted on recent labs. Given recent addition of diovan , will recheck met b to confirm stable.

## 2023-09-13 NOTE — Assessment & Plan Note (Signed)
 Pelvic ultrasound 10/29/22 - Multiple fibroids are identified. The largest measures 2.6 cm. The endometrial stripe is thickened at 12 mm which is abnormally thickened for age. Recommend gynecologic consultation and endometrial sampling. No other abnormalities.was referred to GYN. Appt with gyn 08/10/23 - EMB. Biopsy negative.

## 2023-09-13 NOTE — Assessment & Plan Note (Signed)
 Blood pressure as outlined. Currently taking amlodipine  10mg  q day and diovan  160mg  q day. Check metabolic panel today. If renal function ok, will increase diovan  to 320mg  q day. She has a history of hard to control blood pressure. Has been on several medications previously with persistent elevation.  Check aldosterone renin ratio.

## 2023-09-13 NOTE — Assessment & Plan Note (Addendum)
 Continue lipitor.  Low cholesterol diet and exercise. Follow lipid panel. Triglycerides improved. Discussed diet and exercise. Information given.

## 2023-09-13 NOTE — Assessment & Plan Note (Signed)
Discussed diet and exercise.  Information given.   

## 2023-09-13 NOTE — Progress Notes (Signed)
 Subjective:    Patient ID: Sydney Martinez, female    DOB: [birth year], 63 y.o.   MRN: [id number]  Patient here for  Chief Complaint  Patient presents with   Medical Management of Chronic Issues    HPI Here for a scheduled follow up - f/u regarding elevated blood pressure. Documented history of ovarian cyst. Pelvic ultrasound 10/29/22 - Multiple fibroids are identified. The largest measures 2.6 cm. The endometrial stripe is thickened at 12 mm which is abnormally thickened for age. Recommend gynecologic consultation and endometrial sampling. No other abnormalities.was referred to GYN. Appt with gyn 08/10/23 - EMB. Biopsy negative. Seen in ER 08/12/23 - elevated blood pressure and headache. CT unremarkable. Blood pressure improved without intervention. She changed her amlodipine  dosing and started herself on amlodipine  10mg  bid.  last visit, amlodipine  was decreased to 10mg  q day. Diovan  was increased to 160mg  q day. She reports she is doing relatively well. No chest pain or sob reported. Is concerned regarding her weight. Discussed diet and exercise. She has decreased sodium intake. No abdominal pain reported. Needs f/u met b   Past Medical History:  Diagnosis Date   Arthritis    Asthma    GERD (gastroesophageal reflux disease)    Hyperlipidemia    Hypertension    Past Surgical History:  Procedure Laterality Date   APPENDECTOMY     Family History  Problem Relation Age of Onset   Hyperlipidemia Mother    Arthritis Mother    Hyperlipidemia Father    Hyperlipidemia Sister    Breast cancer Neg Hx    Social History   Socioeconomic History   Marital status: Married    Spouse name: Not on file   Number of children: Not on file   Years of education: Not on file   Highest education level: Not on file  Occupational History   Not on file  Tobacco Use   Smoking status: Never   Smokeless tobacco: Never  Vaping Use   Vaping status: Never Used  Substance and Sexual Activity   Alcohol  use: No    Alcohol/week: 0.0 standard drinks of alcohol   Drug use: No   Sexual activity: Yes  Other Topics Concern   Not on file  Social History Narrative   Not on file   Social Drivers of Health   Financial Resource Strain: Not on file  Food Insecurity: Not on file  Transportation Needs: Not on file  Physical Activity: Not on file  Stress: Not on file  Social Connections: Not on file     Review of Systems  Constitutional:  Negative for appetite change and unexpected weight change.  HENT:  Negative for congestion and sinus pressure.   Respiratory:  Negative for cough, chest tightness and shortness of breath.   Cardiovascular:  Negative for chest pain, palpitations and leg swelling.  Gastrointestinal:  Negative for abdominal pain, diarrhea, nausea and vomiting.  Genitourinary:  Negative for difficulty urinating and dysuria.  Musculoskeletal:  Negative for joint swelling and myalgias.  Skin:  Negative for color change and rash.  Neurological:  Negative for dizziness and headaches.  Psychiatric/Behavioral:  Negative for agitation and dysphoric mood.        Objective:     BP (!) 144/86   Pulse 95   Resp 16   Ht 5\' 3"  (1.6 m)   Wt 174 lb 9.6 oz (79.2 kg)   LMP  (LMP Unknown)   SpO2 98%   BMI 30.93 kg/m  Wt Readings  from Last 3 Encounters:  09/13/23 174 lb 9.6 oz (79.2 kg)  08/16/23 175 lb 6.4 oz (79.6 kg)  08/12/23 176 lb (79.8 kg)    Physical Exam Vitals reviewed.  Constitutional:      General: She is not in acute distress.    Appearance: Normal appearance.  HENT:     Head: Normocephalic and atraumatic.     Right Ear: External ear normal.     Left Ear: External ear normal.     Mouth/Throat:     Pharynx: No oropharyngeal exudate or posterior oropharyngeal erythema.  Eyes:     General: No scleral icterus.       Right eye: No discharge.        Left eye: No discharge.     Conjunctiva/sclera: Conjunctivae normal.  Neck:     Thyroid : No thyromegaly.   Cardiovascular:     Rate and Rhythm: Normal rate and regular rhythm.  Pulmonary:     Effort: No respiratory distress.     Breath sounds: Normal breath sounds. No wheezing.  Abdominal:     General: Bowel sounds are normal.     Palpations: Abdomen is soft.     Tenderness: There is no abdominal tenderness.  Musculoskeletal:        General: No swelling or tenderness.     Cervical back: Neck supple. No tenderness.  Lymphadenopathy:     Cervical: No cervical adenopathy.  Skin:    Findings: No erythema or rash.  Neurological:     Mental Status: She is alert.  Psychiatric:        Mood and Affect: Mood normal.        Behavior: Behavior normal.         Outpatient Encounter Medications as of 09/13/2023  Medication Sig   amLODipine  (NORVASC ) 10 MG tablet Take 1 tablet (10 mg total) by mouth daily. Take in the evening.   atorvastatin  (LIPITOR) 40 MG tablet Take 1 tablet (40 mg total) by mouth daily.   cetirizine (ZYRTEC) 10 MG tablet cetirizine 10 mg tablet  TAKE 1 TABLET BY MOUTH EVERY DAY   fluticasone  (FLONASE ) 50 MCG/ACT nasal spray Place 1 spray into both nostrils daily.   pantoprazole  (PROTONIX ) 40 MG tablet Take 1 tablet (40 mg total) by mouth daily.   valsartan  (DIOVAN ) 160 MG tablet Take 1 tablet (160 mg total) by mouth daily.   [DISCONTINUED] atorvastatin  (LIPITOR) 40 MG tablet Take 1 tablet (40 mg total) by mouth daily.   No facility-administered encounter medications on file as of 09/13/2023.     Lab Results  Component Value Date   WBC 6.6 09/07/2023   HGB 12.9 09/07/2023   HCT 39.1 09/07/2023   PLT 296.0 09/07/2023   GLUCOSE 105 (H) 09/13/2023   CHOL 201 (H) 09/07/2023   TRIG 201.0 (H) 09/07/2023   HDL 31.10 (L) 09/07/2023   LDLDIRECT 175.0 09/23/2022   LDLCALC 129 (H) 09/07/2023   ALT 39 (H) 09/07/2023   AST 42 (H) 09/07/2023   NA 140 09/13/2023   K 4.1 09/13/2023   CL 102 09/13/2023   CREATININE 0.93 09/13/2023   BUN 11 09/13/2023   CO2 29 09/13/2023    TSH 2.66 09/07/2023    MM 3D DIAGNOSTIC MAMMOGRAM UNILATERAL LEFT BREAST Result Date: 09/01/2023 CLINICAL DATA:  Screening recall for a possible LEFT breast asymmetry EXAM: DIGITAL DIAGNOSTIC UNILATERAL LEFT MAMMOGRAM WITH TOMOSYNTHESIS AND CAD TECHNIQUE: Left digital diagnostic mammography and breast tomosynthesis was performed. The images were evaluated with computer-aided detection.  COMPARISON:  Previous exam(s). ACR Breast Density Category b: There are scattered areas of fibroglandular density. FINDINGS: The previously described possible asymmetry in the upper left breast does not persist with additional views, consistent with superimposed fibroglandular tissue. No suspicious mass, microcalcification, or other finding is identified in the left breast. IMPRESSION: No mammographic evidence of malignancy in the LEFT breast. RECOMMENDATION: Return to routine screening mammography is recommended. The patient will be due for screening in April 2026. I have discussed the findings and recommendations with the patient. If applicable, a reminder letter will be sent to the patient regarding the next appointment. BI-RADS CATEGORY  1: Negative. Electronically Signed   By: Sande Cromer M.D.   On: 09/01/2023 14:13       Assessment & Plan:  Decreased GFR Assessment & Plan: Noted on recent labs. Given recent addition of diovan , will recheck met b to confirm stable.   Orders: -     Basic metabolic panel with GFR  Hypercholesteremia Assessment & Plan: Continue lipitor.  Low cholesterol diet and exercise. Follow lipid panel. Triglycerides improved. Discussed diet and exercise. Information given.   Orders: -     Hepatic function panel; Future -     Lipid panel; Future  Hypertension, unspecified type Assessment & Plan: Blood pressure as outlined. Currently taking amlodipine  10mg  q day and diovan  160mg  q day. Check metabolic panel today. If renal function ok, will increase diovan  to 320mg  q day. She has a  history of hard to control blood pressure. Has been on several medications previously with persistent elevation.  Check aldosterone renin ratio.   Orders: -     Basic metabolic panel with GFR; Future -     Aldosterone + renin activity w/ ratio  Cervical cancer screening Assessment & Plan: Pelvic ultrasound 10/29/22 - Multiple fibroids are identified. The largest measures 2.6 cm. The endometrial stripe is thickened at 12 mm which is abnormally thickened for age. Recommend gynecologic consultation and endometrial sampling. No other abnormalities.was referred to GYN. Appt with gyn 08/10/23 - EMB. Biopsy negative.    Gastroesophageal reflux disease, unspecified whether esophagitis present Assessment & Plan: No upper symptoms reported. Continue protonix .    Thickened endometrium Assessment & Plan: Pelvic ultrasound 10/29/22 - Multiple fibroids are identified. The largest measures 2.6 cm. The endometrial stripe is thickened at 12 mm which is abnormally thickened for age. Recommend gynecologic consultation and endometrial sampling. No other abnormalities.was referred to GYN. Appt with gyn 08/10/23 - EMB. Biopsy negative.    Weight loss counseling, encounter for Assessment & Plan: Discussed diet and exercise. Information given.    Other orders -     Atorvastatin  Calcium ; Take 1 tablet (40 mg total) by mouth daily.  Dispense: 30 tablet; Refill: 1     Dellar Fenton, MD

## 2023-09-13 NOTE — Assessment & Plan Note (Signed)
 No upper symptoms reported.  Continue protonix.

## 2023-09-18 LAB — ALDOSTERONE + RENIN ACTIVITY W/ RATIO
ALDO / PRA Ratio: 5.6 ratio (ref 0.9–28.9)
Aldosterone: 4 ng/dL
Renin Activity: 0.71 ng/mL/h (ref 0.25–5.82)

## 2023-09-22 ENCOUNTER — Other Ambulatory Visit: Payer: Self-pay

## 2023-09-22 ENCOUNTER — Emergency Department

## 2023-09-22 ENCOUNTER — Encounter: Payer: Self-pay | Admitting: Intensive Care

## 2023-09-22 ENCOUNTER — Emergency Department
Admission: EM | Admit: 2023-09-22 | Discharge: 2023-09-22 | Disposition: A | Discharge status: Home / Self Care | Attending: Emergency Medicine | Admitting: Emergency Medicine

## 2023-09-22 DIAGNOSIS — I10 Essential (primary) hypertension: Secondary | ICD-10-CM | POA: Insufficient documentation

## 2023-09-22 DIAGNOSIS — H538 Other visual disturbances: Secondary | ICD-10-CM | POA: Diagnosis not present

## 2023-09-22 DIAGNOSIS — R030 Elevated blood-pressure reading, without diagnosis of hypertension: Secondary | ICD-10-CM | POA: Diagnosis present

## 2023-09-22 DIAGNOSIS — R079 Chest pain, unspecified: Secondary | ICD-10-CM | POA: Insufficient documentation

## 2023-09-22 DIAGNOSIS — R519 Headache, unspecified: Secondary | ICD-10-CM | POA: Insufficient documentation

## 2023-09-22 LAB — BASIC METABOLIC PANEL WITH GFR
Anion gap: 8 (ref 5–15)
BUN: 15 mg/dL (ref 8–23)
CO2: 23 mmol/L (ref 22–32)
Calcium: 9.5 mg/dL (ref 8.9–10.3)
Chloride: 103 mmol/L (ref 98–111)
Creatinine, Ser: 0.85 mg/dL (ref 0.44–1.00)
GFR, Estimated: >60 mL/min (ref >60)
Glucose, Bld: 98 mg/dL (ref 70–99)
Potassium: 3.7 mmol/L (ref 3.5–5.1)
Sodium: 134 mmol/L — ABNORMAL LOW (ref 135–145)

## 2023-09-22 LAB — CBC WITH DIFFERENTIAL/PLATELET
Abs Immature Granulocytes: 0.02 10*3/uL (ref 0.00–0.07)
Basophils Absolute: 0.1 10*3/uL (ref 0.0–0.1)
Basophils Relative: 1 %
Eosinophils Absolute: 0.1 10*3/uL (ref 0.0–0.5)
Eosinophils Relative: 1 %
HCT: 39.1 % (ref 36.0–46.0)
Hemoglobin: 12.7 g/dL (ref 12.0–15.0)
Immature Granulocytes: 0 %
Lymphocytes Relative: 43 %
Lymphs Abs: 3.3 10*3/uL (ref 0.7–4.0)
MCH: 26.8 pg (ref 26.0–34.0)
MCHC: 32.5 g/dL (ref 30.0–36.0)
MCV: 82.7 fL (ref 80.0–100.0)
Monocytes Absolute: 0.5 10*3/uL (ref 0.1–1.0)
Monocytes Relative: 6 %
Neutro Abs: 3.9 10*3/uL (ref 1.7–7.7)
Neutrophils Relative %: 49 %
Platelets: 252 10*3/uL (ref 150–400)
RBC: 4.73 MIL/uL (ref 3.87–5.11)
RDW: 13.2 % (ref 11.5–15.5)
WBC: 7.8 10*3/uL (ref 4.0–10.5)
nRBC: 0 % (ref 0.0–0.2)

## 2023-09-22 LAB — TROPONIN I (HIGH SENSITIVITY)
Troponin I (High Sensitivity): 11 ng/L (ref <18)
Troponin I (High Sensitivity): 11 ng/L (ref <18)

## 2023-09-22 NOTE — ED Provider Triage Note (Signed)
 Emergency Medicine Provider Triage Evaluation Note  Sydney Martinez , a 63 y.o. female  was evaluated in triage.  Pt complains of high blood pressure with CP, SOB and headache. Patient does take daily BP meds and has been compliant with them.  Review of Systems  Positive: See above, blurry vision Negative:   Physical Exam  LMP  (LMP Unknown)  Gen:   Awake, no distress   Resp:  Normal effort  MSK:   Moves extremities without difficulty  Other:    Medical Decision Making  Medically screening exam initiated at 5:29 PM.  Appropriate orders placed.  Avenleigh Unnerstall was informed that the remainder of the evaluation will be completed by another provider, this initial triage assessment does not replace that evaluation, and the importance of remaining in the ED until their evaluation is complete.     Phyliss Breen, PA-C 09/22/23 1731

## 2023-09-22 NOTE — ED Provider Notes (Signed)
 [facility] Provider Note    Event Date/Time   First MD Initiated Contact with Patient 09/22/23 1823     (approximate)   History   Headache and Chest Pain   HPI  Sydney Martinez is a 63 y.o. female  who presents to the emergency department today because of concern for headache, vision change and high blood pressure. Patient has history of hypertension, has been working with her doctor who has been adjusting her medication. Started feeling bad yesterday. Today continued to have headache, blurred vision. Checked her blood pressure and noted it was high. Did have some dizziness and chest pain as well. At the time of my examination the patient feels significant improvement in her symptoms.     Physical Exam   Triage Vital Signs: ED Triage Vitals [09/22/23 1731]  Encounter Vitals Group     BP (!) 193/114     Systolic BP Percentile      Diastolic BP Percentile      Pulse Rate 78     Resp 16     Temp (!) 97.5 F (36.4 C)     Temp Source Oral     SpO2 100 %     Weight 175 lb (79.4 kg)     Height 5\' 3"  (1.6 m)     Head Circumference      Peak Flow      Pain Score 5     Pain Loc      Pain Education      Exclude from Growth Chart     Most recent vital signs: Vitals:   09/22/23 1731  BP: (!) 193/114  Pulse: 78  Resp: 16  Temp: (!) 97.5 F (36.4 C)  SpO2: 100%   General: Awake, alert, oriented. CV:  Good peripheral perfusion. Regular rate and rhythm. Resp:  Normal effort. Lungs clear. Abd:  No distention.    ED Results / Procedures / Treatments   Labs (all labs ordered are listed, but only abnormal results are displayed) Labs Reviewed  BASIC METABOLIC PANEL WITH GFR - Abnormal; Notable for the following components:      Result Value   Sodium 134 (*)    All other components within normal limits  CBC WITH DIFFERENTIAL/PLATELET  TROPONIN I (HIGH SENSITIVITY)  TROPONIN I (HIGH SENSITIVITY)     EKG  I, Marylynn Soho, attending  physician, personally viewed and interpreted this EKG  EKG Time: 1736 Rate: 78 Rhythm: normal sinus rhythm Axis: normal Intervals: qtc 440 QRS: LVH ST changes: no st elevation Impression: abnormal ekg   RADIOLOGY I independently interpreted and visualized the CXR. My interpretation: No pneumonia Radiology interpretation:  IMPRESSION:  No active cardiopulmonary disease.    I independently interpreted and visualized the CT head. My interpretation: No ICH Radiology interpretation:  IMPRESSION:  1. Generalized cerebral atrophy with chronic white matter small  vessel ischemic changes.  2. No acute intracranial abnormality.      PROCEDURES:  Critical Care performed: No    MEDICATIONS ORDERED IN ED: Medications - No data to display   IMPRESSION / MDM / ASSESSMENT AND PLAN / ED COURSE  I reviewed the triage vital signs and the nursing notes.                              Differential diagnosis includes, but is not limited to, ACS, hypertension, ICH  Patient's presentation is most consistent with acute presentation with  potential threat to life or bodily function.  Patient presented to the ED today with concern of dizziness, headache, change in vision and high blood pressure. At the time of my examination the patient is feeling better and blood pressure had improved. CT head without concerning abnormality. Blood work, EKG and CXR without concerning abnormality. At this time do think her symptoms likely related to the elevated blood pressure. Did encourage patient to follow up with her PCP.     FINAL CLINICAL IMPRESSION(S) / ED DIAGNOSES   Final diagnoses:  Hypertension, unspecified type      Note:  This document was prepared using Dragon voice recognition software and may include unintentional dictation errors.    Marylynn Soho, MD 09/22/23 [phone]

## 2023-09-22 NOTE — ED Triage Notes (Signed)
 Patient c/o headache and central chest pain that started today. Reports some SOB and dizziness. Reports some intermittent blurry vision  Takes blood pressure medicine daily.

## 2023-10-07 ENCOUNTER — Encounter: Payer: Self-pay | Admitting: Internal Medicine

## 2023-10-07 DIAGNOSIS — I10 Essential (primary) hypertension: Secondary | ICD-10-CM

## 2023-10-07 NOTE — Telephone Encounter (Signed)
 I have placed the order for the referral. Please confirm doing ok.  If persistent elevated blood pressure can schedule appt here until can get in to see Dr Laurena Pong.

## 2023-10-07 NOTE — Telephone Encounter (Signed)
 Patient is requesting referral for elevated BP to:  [facility] Hypertension Dr Arvin Bishop for referral?

## 2023-10-18 ENCOUNTER — Ambulatory Visit (INDEPENDENT_AMBULATORY_CARE_PROVIDER_SITE_OTHER): Admitting: Internal Medicine

## 2023-10-18 DIAGNOSIS — I10 Essential (primary) hypertension: Secondary | ICD-10-CM

## 2023-10-18 NOTE — Progress Notes (Deleted)
 Subjective:    Patient ID: Sydney Martinez, female    DOB: Sep 21, 1960, 63 y.o.   MRN: [id number]  Patient here for No chief complaint on file.   HPI Here for a scheduled follow up - follow up regarding hypercholesterolemia and hypertension. Was seen in ED 09/22/23 - concern regarding high blood pressure and vision change. CT head - ok. Labs unrevealing. Currently on amlodipine  10mg  and diovan  recently increased to 320mg  q day.    Past Medical History:  Diagnosis Date   Arthritis    Asthma    GERD (gastroesophageal reflux disease)    Hyperlipidemia    Hypertension    Past Surgical History:  Procedure Laterality Date   APPENDECTOMY     Family History  Problem Relation Age of Onset   Hyperlipidemia Mother    Arthritis Mother    Hyperlipidemia Father    Hyperlipidemia Sister    Breast cancer Neg Hx    Social History   Socioeconomic History   Marital status: Married    Spouse name: Not on file   Number of children: Not on file   Years of education: Not on file   Highest education level: Not on file  Occupational History   Not on file  Tobacco Use   Smoking status: Never   Smokeless tobacco: Never  Vaping Use   Vaping status: Never Used  Substance and Sexual Activity   Alcohol use: No    Alcohol/week: 0.0 standard drinks of alcohol   Drug use: No   Sexual activity: Yes  Other Topics Concern   Not on file  Social History Narrative   Not on file   Social Drivers of Health   Financial Resource Strain: Not on file  Food Insecurity: Not on file  Transportation Needs: Not on file  Physical Activity: Not on file  Stress: Not on file  Social Connections: Not on file     Review of Systems     Objective:     LMP  (LMP Unknown)  Wt Readings from Last 3 Encounters:  09/22/23 175 lb (79.4 kg)  09/13/23 174 lb 9.6 oz (79.2 kg)  08/16/23 175 lb 6.4 oz (79.6 kg)    Physical Exam  {Perform Simple Foot Exam  Perform Detailed exam:1} {Insert foot Exam  (Optional):30965}   Outpatient Encounter Medications as of 10/18/2023  Medication Sig   amLODipine  (NORVASC ) 10 MG tablet Take 1 tablet (10 mg total) by mouth daily. Take in the evening.   atorvastatin  (LIPITOR) 40 MG tablet Take 1 tablet (40 mg total) by mouth daily.   cetirizine (ZYRTEC) 10 MG tablet cetirizine 10 mg tablet  TAKE 1 TABLET BY MOUTH EVERY DAY   fluticasone  (FLONASE ) 50 MCG/ACT nasal spray Place 1 spray into both nostrils daily.   pantoprazole  (PROTONIX ) 40 MG tablet Take 1 tablet (40 mg total) by mouth daily.   valsartan  (DIOVAN ) 160 MG tablet Take 1 tablet (160 mg total) by mouth daily.   No facility-administered encounter medications on file as of 10/18/2023.     Lab Results  Component Value Date   WBC 7.8 09/22/2023   HGB 12.7 09/22/2023   HCT 39.1 09/22/2023   PLT 252 09/22/2023   GLUCOSE 98 09/22/2023   CHOL 201 (H) 09/07/2023   TRIG 201.0 (H) 09/07/2023   HDL 31.10 (L) 09/07/2023   LDLDIRECT 175.0 09/23/2022   LDLCALC 129 (H) 09/07/2023   ALT 39 (H) 09/07/2023   AST 42 (H) 09/07/2023   NA 134 (L) 09/22/2023  K 3.7 09/22/2023   CL 103 09/22/2023   CREATININE 0.85 09/22/2023   BUN 15 09/22/2023   CO2 23 09/22/2023   TSH 2.66 09/07/2023    DG Chest 2 View Result Date: 09/22/2023 CLINICAL DATA:  Chest pain. EXAM: CHEST - 2 VIEW COMPARISON:  None Available. FINDINGS: The heart size and mediastinal contours are within normal limits. Mild linear atelectasis is seen within the left lung base. There is no evidence of acute infiltrate, pleural effusion or pneumothorax. The visualized skeletal structures are unremarkable. IMPRESSION: No active cardiopulmonary disease. Electronically Signed   By: Virgle Grime M.D.   On: 09/22/2023 18:40   CT Head Wo Contrast Result Date: 09/22/2023 CLINICAL DATA:  Headache and central chest pain. EXAM: CT HEAD WITHOUT CONTRAST TECHNIQUE: Contiguous axial images were obtained from the base of the skull through the vertex without  intravenous contrast. RADIATION DOSE REDUCTION: This exam was performed according to the departmental dose-optimization program which includes automated exposure control, adjustment of the mA and/or kV according to patient size and/or use of iterative reconstruction technique. COMPARISON:  August 12, 2023 FINDINGS: Brain: There is generalized cerebral atrophy with widening of the extra-axial spaces and ventricular dilatation. There are areas of decreased attenuation within the white matter tracts of the supratentorial brain, consistent with microvascular disease changes. Areas of chronic left frontal lobe, left parietal lobe and left occipital lobe encephalomalacia are noted. Vascular: No hyperdense vessel or unexpected calcification. Skull: Normal. Negative for fracture or focal lesion. Sinuses/Orbits: No acute finding. Other: None. IMPRESSION: 1. Generalized cerebral atrophy with chronic white matter small vessel ischemic changes. 2. No acute intracranial abnormality. Electronically Signed   By: Virgle Grime M.D.   On: 09/22/2023 18:39       Assessment & Plan:  There are no diagnoses linked to this encounter.   Dellar Fenton, MD

## 2023-10-23 ENCOUNTER — Encounter: Payer: Self-pay | Admitting: Internal Medicine

## 2023-10-23 NOTE — Progress Notes (Signed)
 Patient ID: Sydney Martinez, female   DOB: [birth year], 63 y.o.   MRN: [id number] Did not show for appt.

## 2023-11-07 ENCOUNTER — Ambulatory Visit: Admitting: Internal Medicine

## 2023-11-07 VITALS — BP 150/80 | HR 88 | Temp 98.2°F | Resp 16 | Ht 63.0 in | Wt 169.6 lb

## 2023-11-07 DIAGNOSIS — I10 Essential (primary) hypertension: Secondary | ICD-10-CM

## 2023-11-07 DIAGNOSIS — E7800 Pure hypercholesterolemia, unspecified: Secondary | ICD-10-CM | POA: Diagnosis not present

## 2023-11-07 DIAGNOSIS — Z1211 Encounter for screening for malignant neoplasm of colon: Secondary | ICD-10-CM | POA: Diagnosis not present

## 2023-11-07 DIAGNOSIS — K219 Gastro-esophageal reflux disease without esophagitis: Secondary | ICD-10-CM | POA: Diagnosis not present

## 2023-11-07 MED ORDER — VALSARTAN 320 MG PO TABS: 320.0000 mg | ORAL_TABLET | Freq: Every day | ORAL | 2 refills | Status: DC

## 2023-11-07 NOTE — Progress Notes (Signed)
 Subjective:    Patient ID: Sydney Martinez, female    DOB: 1961-03-16, 63 y.o.   MRN: [id number]  Patient here for  Chief Complaint  Patient presents with   Medical Management of Chronic Issues    HPI Here for a scheduled follow up - follow up regarding her blood pressure. Increased stress. Mother recently passed. Does not feel needs any further intervention at this time. No chest pain or sob reported. Taking her amlodipine  and diovan  regularly.    Past Medical History:  Diagnosis Date   Arthritis    Asthma    GERD (gastroesophageal reflux disease)    Hyperlipidemia    Hypertension    Past Surgical History:  Procedure Laterality Date   APPENDECTOMY     Family History  Problem Relation Age of Onset   Hyperlipidemia Mother    Arthritis Mother    Hyperlipidemia Father    Hyperlipidemia Sister    Breast cancer Neg Hx    Social History   Socioeconomic History   Marital status: Married    Spouse name: Not on file   Number of children: Not on file   Years of education: Not on file   Highest education level: Not on file  Occupational History   Not on file  Tobacco Use   Smoking status: Never   Smokeless tobacco: Never  Vaping Use   Vaping status: Never Used  Substance and Sexual Activity   Alcohol use: No    Alcohol/week: 0.0 standard drinks of alcohol   Drug use: No   Sexual activity: Yes  Other Topics Concern   Not on file  Social History Narrative   Not on file   Social Drivers of Health   Financial Resource Strain: Not on file  Food Insecurity: Not on file  Transportation Needs: Not on file  Physical Activity: Not on file  Stress: Not on file  Social Connections: Not on file     Review of Systems  Constitutional:  Negative for appetite change and unexpected weight change.  HENT:  Negative for congestion and sinus pressure.   Respiratory:  Negative for cough, chest tightness and shortness of breath.   Cardiovascular:  Negative for chest pain,  palpitations and leg swelling.  Gastrointestinal:  Negative for abdominal pain, diarrhea, nausea and vomiting.  Genitourinary:  Negative for difficulty urinating and dysuria.  Musculoskeletal:  Negative for joint swelling and myalgias.  Skin:  Negative for color change and rash.  Neurological:  Negative for dizziness and headaches.  Psychiatric/Behavioral:  Negative for agitation and dysphoric mood.        Objective:     BP (!) 150/80   Pulse 88   Temp 98.2 F (36.8 C)   Resp 16   Ht 5' 3 (1.6 m)   Wt 169 lb 9.6 oz (76.9 kg)   LMP  (LMP Unknown)   SpO2 98%   BMI 30.04 kg/m  Wt Readings from Last 3 Encounters:  11/07/23 169 lb 9.6 oz (76.9 kg)  09/22/23 175 lb (79.4 kg)  09/13/23 174 lb 9.6 oz (79.2 kg)    Physical Exam Vitals reviewed.  Constitutional:      General: She is not in acute distress.    Appearance: Normal appearance.  HENT:     Head: Normocephalic and atraumatic.     Right Ear: External ear normal.     Left Ear: External ear normal.     Mouth/Throat:     Pharynx: No oropharyngeal exudate or posterior oropharyngeal  erythema.   Eyes:     General: No scleral icterus.       Right eye: No discharge.        Left eye: No discharge.     Conjunctiva/sclera: Conjunctivae normal.   Neck:     Thyroid : No thyromegaly.   Cardiovascular:     Rate and Rhythm: Normal rate and regular rhythm.  Pulmonary:     Effort: No respiratory distress.     Breath sounds: Normal breath sounds. No wheezing.  Abdominal:     General: Bowel sounds are normal.     Palpations: Abdomen is soft.     Tenderness: There is no abdominal tenderness.   Musculoskeletal:        General: No swelling or tenderness.     Cervical back: Neck supple. No tenderness.  Lymphadenopathy:     Cervical: No cervical adenopathy.   Skin:    Findings: No erythema or rash.   Neurological:     Mental Status: She is alert.   Psychiatric:        Mood and Affect: Mood normal.        Behavior:  Behavior normal.         Outpatient Encounter Medications as of 11/07/2023  Medication Sig   [DISCONTINUED] valsartan  (DIOVAN ) 320 MG tablet Take 320 mg by mouth daily.   amLODipine  (NORVASC ) 10 MG tablet Take 1 tablet (10 mg total) by mouth daily. Take in the evening.   atorvastatin  (LIPITOR) 40 MG tablet Take 1 tablet (40 mg total) by mouth daily.   cetirizine (ZYRTEC) 10 MG tablet cetirizine 10 mg tablet  TAKE 1 TABLET BY MOUTH EVERY DAY   fluticasone  (FLONASE ) 50 MCG/ACT nasal spray Place 1 spray into both nostrils daily.   pantoprazole  (PROTONIX ) 40 MG tablet Take 1 tablet (40 mg total) by mouth daily.   valsartan  (DIOVAN ) 320 MG tablet Take 1 tablet (320 mg total) by mouth daily.   [DISCONTINUED] valsartan  (DIOVAN ) 160 MG tablet Take 1 tablet (160 mg total) by mouth daily.   No facility-administered encounter medications on file as of 11/07/2023.     Lab Results  Component Value Date   WBC 7.8 09/22/2023   HGB 12.7 09/22/2023   HCT 39.1 09/22/2023   PLT 252 09/22/2023   GLUCOSE 98 09/22/2023   CHOL 201 (H) 09/07/2023   TRIG 201.0 (H) 09/07/2023   HDL 31.10 (L) 09/07/2023   LDLDIRECT 175.0 09/23/2022   LDLCALC 129 (H) 09/07/2023   ALT 39 (H) 09/07/2023   AST 42 (H) 09/07/2023   NA 134 (L) 09/22/2023   K 3.7 09/22/2023   CL 103 09/22/2023   CREATININE 0.85 09/22/2023   BUN 15 09/22/2023   CO2 23 09/22/2023   TSH 2.66 09/07/2023    DG Chest 2 View Result Date: 09/22/2023 CLINICAL DATA:  Chest pain. EXAM: CHEST - 2 VIEW COMPARISON:  None Available. FINDINGS: The heart size and mediastinal contours are within normal limits. Mild linear atelectasis is seen within the left lung base. There is no evidence of acute infiltrate, pleural effusion or pneumothorax. The visualized skeletal structures are unremarkable. IMPRESSION: No active cardiopulmonary disease. Electronically Signed   By: Virgle Grime M.D.   On: 09/22/2023 18:40   CT Head Wo Contrast Result Date:  09/22/2023 CLINICAL DATA:  Headache and central chest pain. EXAM: CT HEAD WITHOUT CONTRAST TECHNIQUE: Contiguous axial images were obtained from the base of the skull through the vertex without intravenous contrast. RADIATION DOSE REDUCTION: This exam was  performed according to the departmental dose-optimization program which includes automated exposure control, adjustment of the mA and/or kV according to patient size and/or use of iterative reconstruction technique. COMPARISON:  August 12, 2023 FINDINGS: Brain: There is generalized cerebral atrophy with widening of the extra-axial spaces and ventricular dilatation. There are areas of decreased attenuation within the white matter tracts of the supratentorial brain, consistent with microvascular disease changes. Areas of chronic left frontal lobe, left parietal lobe and left occipital lobe encephalomalacia are noted. Vascular: No hyperdense vessel or unexpected calcification. Skull: Normal. Negative for fracture or focal lesion. Sinuses/Orbits: No acute finding. Other: None. IMPRESSION: 1. Generalized cerebral atrophy with chronic white matter small vessel ischemic changes. 2. No acute intracranial abnormality. Electronically Signed   By: Virgle Grime M.D.   On: 09/22/2023 18:39       Assessment & Plan:  Hypertension, unspecified type Assessment & Plan: Blood pressure remaining elevated above goal. Last visit, we had discussed increasing diovan  to 320mg  q day. It appears she is still taking diovan  160mg  q day. Also taking amlodipine  10mg  q day. Will increase diovan  to 320mg  q day. Follow pressures.    Colon cancer screening Assessment & Plan: Colonoscopy 10/2020 - one 4mm polyp in descending colon.  Internal hemorrhoids.  Diverticulosis.  Pathology - polypoid colonic mucosa - negative.    Gastroesophageal reflux disease, unspecified whether esophagitis present Assessment & Plan: No upper symptoms reported. Continue protonix .     Hypercholesteremia Assessment & Plan: Continue lipitor.  Low cholesterol diet and exercise. Follow lipid panel.    Other orders -     Valsartan ; Take 1 tablet (320 mg total) by mouth daily.  Dispense: 30 tablet; Refill: 2     Dellar Fenton, MD

## 2023-11-11 ENCOUNTER — Encounter: Payer: Self-pay | Admitting: Internal Medicine

## 2023-11-11 NOTE — Assessment & Plan Note (Signed)
Continue lipitor.  Low cholesterol diet and exercise.  Follow lipid panel.

## 2023-11-11 NOTE — Assessment & Plan Note (Signed)
 No upper symptoms reported.  Continue protonix.

## 2023-11-11 NOTE — Assessment & Plan Note (Signed)
Colonoscopy 10/2020 - one 4mm polyp in descending colon.  Internal hemorrhoids.  Diverticulosis.  Pathology - polypoid colonic mucosa - negative.  

## 2023-11-11 NOTE — Assessment & Plan Note (Addendum)
 Blood pressure remaining elevated above goal. Last visit, we had discussed increasing diovan  to 320mg  q day. It appears she is still taking diovan  160mg  q day. Also taking amlodipine  10mg  q day. Will increase diovan  to 320mg  q day. Follow pressures.

## 2023-11-23 ENCOUNTER — Telehealth: Payer: Self-pay

## 2023-11-23 ENCOUNTER — Ambulatory Visit: Payer: Self-pay

## 2023-11-23 NOTE — Telephone Encounter (Signed)
 FYI Only or Action Required?: Action required by provider: update on patient condition and med change.  Patient was last seen in primary care on 11/07/2023 by Glendia Shad, MD. Called Nurse Triage reporting Nausea, leg cramps, and Medication Problem. Symptoms began several weeks ago. Interventions attempted: Rest, hydration, or home remedies and Other: took half dose of rx. Symptoms are: gradually worsening.  Triage Disposition: See PCP When Office is Open (Within 3 Days)  Patient/caregiver understands and will follow disposition?: No, wishes to speak with PCP  Message from Burnard DEL sent at 11/23/2023  9:56 AM EDT  Reason for Triage: nausea,cramps in legs from medication valsartan  (DIOVAN ) 320 MG tablet,dizziness   Reason for Disposition  Nausea lasts > 1 week  [1] MODERATE pain (e.g., interferes with normal activities) AND [2] present > 3 days  Answer Assessment - Initial Assessment Questions 1. NAUSEA SEVERITY: How bad is the nausea? (e.g., mild, moderate, severe; dehydration, weight loss)   - MILD: loss of appetite without change in eating habits   - MODERATE: decreased oral intake without significant weight loss, dehydration, or malnutrition   - SEVERE: inadequate caloric or fluid intake, significant weight loss, symptoms of dehydration     Severe  2. ONSET: When did the nausea begin?     Two weeks  3. VOMITING: Any vomiting? If Yes, ask: How many times today?     X 1 prior to call 4. RECURRENT SYMPTOM: Have you had nausea before? If Yes, ask: When was the last time? What happened that time?     no 5. CAUSE: What do you think is causing the nausea?     Increase in medication dosage  Additional info: Diovan  was increased and has been very nauseous since, she has given it two weeks and body has not adjusted. Vomited X 1.  Answer Assessment - Initial Assessment Questions 1. ONSET: When did the muscle aches or body pains start?      After increasing diovan , about  2 weeks 2. LOCATION: What part of your body is hurting? (e.g., entire body, arms, legs)      Bilateral legs 3. SEVERITY: How bad is the pain? (Scale 1-10; or mild, moderate, severe)   - MILD (1-3): doesn't interfere with normal activities    - MODERATE (4-7): interferes with normal activities or awakens from sleep    - SEVERE (8-10):  excruciating pain, unable to do any normal activities      Moderate 4. CAUSE: What do you think is causing the pains?     Increase in medication 5. FEVER: Have you been having fever?     Denies 6. OTHER SYMPTOMS: Do you have any other symptoms? (e.g., chest pain, weakness, rash, cold or flu symptoms, weight loss)     Nausea and vomiting  Additional info: diovan  was increase a few days later she developed nausea and leg cramps, she states she gave it two weeks for her body to adjust but the symptoms are worsening and now vomiting. She states yesterday she cut the pill in half but still symptomatic. Would like Dr. Glendia to change medicine. Please advise.  Protocols used: Nausea-A-AH, Muscle Aches and Body Pain-A-AH

## 2023-11-23 NOTE — Telephone Encounter (Signed)
 E2C2 called with patient on the line but CMA was busy.  Patient stated she could not hold but it is okay for CMA to return her call.

## 2023-11-23 NOTE — Telephone Encounter (Signed)
 Her blood pressure was elevated. If she stops the medication, will need to replace with another blood pressure medication.  Unsure if all symptoms coming from the medication. Needs to be evaluated to determine cause, reassess blood pressure and change medication

## 2023-11-23 NOTE — Telephone Encounter (Signed)
 See other note

## 2023-11-23 NOTE — Telephone Encounter (Unsigned)
 Copied from CRM [id number]. Topic: General - Other >> Nov 23, 2023 12:41 PM [place] P wrote: Reason for CRM: Pt received missed call from [place] - contacted [facility] - check back with PT - pt advise she would like a callback as she did not want to hold.

## 2023-11-23 NOTE — Telephone Encounter (Signed)
 LVM to call back to address concerns and check on patient

## 2023-11-23 NOTE — Telephone Encounter (Signed)
 Called patient. Since increasing her diovan  she has had nausea and leg cramps. She vomited one time this morning. She has tried several different things: taking medication different times of the day, taking on empty stomach vs taking with food, etc. Nothing makes a difference in symptoms. She is staying hydrated. Able to eat and drink as normal. She wants to know if she can stop medication to see if symptoms resolve. She did agree to go to urgent care if her symptoms worsen. She has a f/u with you on 7/21.

## 2023-11-24 ENCOUNTER — Other Ambulatory Visit: Payer: Self-pay

## 2023-11-24 MED ORDER — VALSARTAN 160 MG PO TABS: 160.0000 mg | ORAL_TABLET | Freq: Every day | ORAL | 0 refills | Status: DC

## 2023-11-24 NOTE — Telephone Encounter (Signed)
 LMTCB   Copied from CRM [id number]. Topic: Clinical - Medication Question >> Nov 24, 2023 11:06 AM Revonda D wrote: Reason for CRM: Pt is returning a missed call from [place]. I informed the pt that Sueanne was calling to relay a message from Dr.Scott and was needed some information regarding the symptoms the pt is having from the medication and how her BP is doing. Pt stated that her BP is still elevated and the medication is making her nauseous, slight headaches, and pain in both legs. Pt would like to decrease the medication dose back to 160mg  for the DIOVAN .

## 2023-11-24 NOTE — Telephone Encounter (Signed)
 Pt is aware to discontinue Diovan  320mg  and start the Diovan  160mg  and continue amlodipine .  Have d/c'd the Diovan  320mg  and pended the new Diovan  160mg  order please updated the quantity and number of refills.  Pt would like medication send to [facility] in [place].

## 2023-11-24 NOTE — Telephone Encounter (Signed)
 Ok to call in diovan  160mg  q day.

## 2023-11-24 NOTE — Telephone Encounter (Signed)
 I can send in the 160mg  diovan . She will need to stop the 320mg  diovan . Continue amlodipine . Can see if symptoms improve. Follow pressure and record readings.  If any acute symptoms, needs to be evaluated. Please confirm where she wants the 160mg  diovan  q day sent - which pharmacy?

## 2023-11-24 NOTE — Telephone Encounter (Unsigned)
 Copied from CRM [id number]. Topic: Clinical - Medication Question >> Nov 24, 2023 11:06 AM Revonda D wrote: Reason for CRM: Pt is returning a missed call from [place]. I informed the pt that Sueanne was calling to relay a message from Dr.Scott and was needed some information regarding the symptoms the pt is having from the medication and how her BP is doing. Pt stated that her BP is still elevated and the medication is making her nauseous, slight headaches, and pain in both legs. Pt would like to decrease the medication dose back to 160mg  for the DIOVAN .

## 2023-11-24 NOTE — Telephone Encounter (Signed)
 See new orders only encounter.

## 2023-11-24 NOTE — Telephone Encounter (Signed)
 Please call for update of her symptoms. She is declining to be evaluated now. Given she feels the symptoms occurred with increasing the diovan  to 320mg , she can decrease diovan  back down to 160mg . Continue amlodipine . Need to confirm how blood pressures are doing. Probably will need to add additional medication for ideal blood pressure control.

## 2023-11-24 NOTE — Addendum Note (Signed)
 Addended by: GLENDIA ALLENA RAMAN on: 11/24/2023 04:45 PM   Modules accepted: Orders

## 2023-11-24 NOTE — Addendum Note (Signed)
 Addended by: SEBASTIAN KNEE on: 11/24/2023 04:20 PM   Modules accepted: Orders

## 2023-11-24 NOTE — Telephone Encounter (Signed)
 LM for patient. Please relay message below.

## 2023-11-24 NOTE — Telephone Encounter (Signed)
 Refill sent.

## 2023-12-05 ENCOUNTER — Ambulatory Visit: Admitting: Internal Medicine

## 2023-12-12 ENCOUNTER — Ambulatory Visit: Admitting: Internal Medicine

## 2023-12-12 VITALS — BP 140/86 | HR 90 | Temp 99.8°F | Resp 16 | Ht 63.0 in | Wt 169.0 lb

## 2023-12-12 DIAGNOSIS — R509 Fever, unspecified: Secondary | ICD-10-CM

## 2023-12-12 DIAGNOSIS — I10 Essential (primary) hypertension: Secondary | ICD-10-CM | POA: Diagnosis not present

## 2023-12-12 DIAGNOSIS — B349 Viral infection, unspecified: Secondary | ICD-10-CM

## 2023-12-12 DIAGNOSIS — R52 Pain, unspecified: Secondary | ICD-10-CM

## 2023-12-12 DIAGNOSIS — J029 Acute pharyngitis, unspecified: Secondary | ICD-10-CM

## 2023-12-12 DIAGNOSIS — K219 Gastro-esophageal reflux disease without esophagitis: Secondary | ICD-10-CM

## 2023-12-12 DIAGNOSIS — E7800 Pure hypercholesterolemia, unspecified: Secondary | ICD-10-CM

## 2023-12-12 LAB — POCT INFLUENZA A/B
Influenza A, POC: NEGATIVE
Influenza B, POC: NEGATIVE

## 2023-12-12 LAB — POC COVID19 BINAXNOW: SARS Coronavirus 2 Ag: NEGATIVE

## 2023-12-12 LAB — POCT RAPID STREP A (OFFICE): Rapid Strep A Screen: NEGATIVE

## 2023-12-12 NOTE — Progress Notes (Signed)
 Subjective:    Patient ID: Sydney Martinez, female    DOB: 03-23-1961, 63 y.o.   MRN: [id number]  Patient here for  Chief Complaint  Patient presents with   Medical Management of Chronic Issues    HPI Here for a scheduled follow up - follow up regarding elevated blood pressure and increased stress.  Mother recently passed. Last visit, I increased diovan  to 320mg  q day. Also taking amlodipine . Intolerant to 320mg  diovan . Saw nephrology 12/01/23 - recommended sleep study. Recommended limiting sodium intake. Recommended changing to benica/hydrochlorothiazide. Just started this medication. Tolerating. Blood pressure is coming down. She does report noticing this am - chills and sore throat. Eyes itching. No fever. No nasal congestion. No cough. Noticed minimal diarrhea previously. Eating. Some nausea. No vomiting. Previously had low back pain - pain down legs. Took tylenol. Better today. Denies pain today. Walking today without pain. Reports blood pressures - 136/81, 140-150s/90.    Past Medical History:  Diagnosis Date   Arthritis    Asthma    GERD (gastroesophageal reflux disease)    Hyperlipidemia    Hypertension    Past Surgical History:  Procedure Laterality Date   APPENDECTOMY     Family History  Problem Relation Age of Onset   Hyperlipidemia Mother    Arthritis Mother    Hyperlipidemia Father    Hyperlipidemia Sister    Breast cancer Neg Hx    Social History   Socioeconomic History   Marital status: Married    Spouse name: Not on file   Number of children: Not on file   Years of education: Not on file   Highest education level: Not on file  Occupational History   Not on file  Tobacco Use   Smoking status: Never   Smokeless tobacco: Never  Vaping Use   Vaping status: Never Used  Substance and Sexual Activity   Alcohol use: No    Alcohol/week: 0.0 standard drinks of alcohol   Drug use: No   Sexual activity: Yes  Other Topics Concern   Not on file  Social  History Narrative   Not on file   Social Drivers of Health   Financial Resource Strain: Not on file  Food Insecurity: Not on file  Transportation Needs: Not on file  Physical Activity: Not on file  Stress: Not on file  Social Connections: Not on file     Review of Systems  Constitutional:  Positive for chills. Negative for appetite change and fever.  HENT:  Positive for sore throat.   Eyes:        Eyes itching.   Respiratory:  Negative for cough, chest tightness and shortness of breath.   Cardiovascular:  Negative for chest pain, palpitations and leg swelling.  Gastrointestinal:  Positive for nausea. Negative for abdominal pain and vomiting.       Previous diarrhea. Resolved.   Genitourinary:  Negative for difficulty urinating and dysuria.  Musculoskeletal:  Negative for joint swelling and myalgias.       Back pain - better today.   Skin:  Negative for color change and rash.  Neurological:  Negative for dizziness and headaches.  Psychiatric/Behavioral:  Negative for agitation and dysphoric mood.        Objective:     BP (!) 140/86   Pulse 90   Temp 99.8 F (37.7 C)   Resp 16   Ht 5' 3 (1.6 m)   Wt 169 lb (76.7 kg)   LMP  (LMP Unknown)  SpO2 98%   BMI 29.94 kg/m  Wt Readings from Last 3 Encounters:  12/12/23 169 lb (76.7 kg)  11/07/23 169 lb 9.6 oz (76.9 kg)  09/22/23 175 lb (79.4 kg)    Physical Exam Vitals reviewed.  Constitutional:      General: She is not in acute distress.    Appearance: Normal appearance.  HENT:     Head: Normocephalic and atraumatic.     Right Ear: External ear normal.     Left Ear: External ear normal.     Mouth/Throat:     Pharynx: No oropharyngeal exudate or posterior oropharyngeal erythema.  Eyes:     General: No scleral icterus.       Right eye: No discharge.        Left eye: No discharge.     Conjunctiva/sclera: Conjunctivae normal.  Neck:     Thyroid : No thyromegaly.  Cardiovascular:     Rate and Rhythm: Normal  rate and regular rhythm.  Pulmonary:     Effort: No respiratory distress.     Breath sounds: Normal breath sounds. No wheezing.  Abdominal:     General: Bowel sounds are normal.     Palpations: Abdomen is soft.     Tenderness: There is no abdominal tenderness.  Musculoskeletal:        General: No swelling or tenderness.     Cervical back: Neck supple. No tenderness.  Lymphadenopathy:     Cervical: No cervical adenopathy.  Skin:    Findings: No erythema or rash.  Neurological:     Mental Status: She is alert.  Psychiatric:        Mood and Affect: Mood normal.        Behavior: Behavior normal.         Outpatient Encounter Medications as of 12/12/2023  Medication Sig   olmesartan -hydrochlorothiazide (BENICAR  HCT) 40-12.5 MG tablet Take 1 tablet by mouth daily.   amLODipine  (NORVASC ) 10 MG tablet Take 1 tablet (10 mg total) by mouth daily. Take in the evening.   atorvastatin  (LIPITOR) 40 MG tablet Take 1 tablet (40 mg total) by mouth daily.   cetirizine (ZYRTEC) 10 MG tablet cetirizine 10 mg tablet  TAKE 1 TABLET BY MOUTH EVERY DAY   fluticasone  (FLONASE ) 50 MCG/ACT nasal spray Place 1 spray into both nostrils daily.   pantoprazole  (PROTONIX ) 40 MG tablet Take 1 tablet (40 mg total) by mouth daily.   [DISCONTINUED] valsartan  (DIOVAN ) 160 MG tablet Take 1 tablet (160 mg total) by mouth daily.   No facility-administered encounter medications on file as of 12/12/2023.     Lab Results  Component Value Date   WBC 7.8 09/22/2023   HGB 12.7 09/22/2023   HCT 39.1 09/22/2023   PLT 252 09/22/2023   GLUCOSE 98 09/22/2023   CHOL 201 (H) 09/07/2023   TRIG 201.0 (H) 09/07/2023   HDL 31.10 (L) 09/07/2023   LDLDIRECT 175.0 09/23/2022   LDLCALC 129 (H) 09/07/2023   ALT 39 (H) 09/07/2023   AST 42 (H) 09/07/2023   NA 134 (L) 09/22/2023   K 3.7 09/22/2023   CL 103 09/22/2023   CREATININE 0.85 09/22/2023   BUN 15 09/22/2023   CO2 23 09/22/2023   TSH 2.66 09/07/2023    DG Chest 2  View Result Date: 09/22/2023 CLINICAL DATA:  Chest pain. EXAM: CHEST - 2 VIEW COMPARISON:  None Available. FINDINGS: The heart size and mediastinal contours are within normal limits. Mild linear atelectasis is seen within the left lung base.  There is no evidence of acute infiltrate, pleural effusion or pneumothorax. The visualized skeletal structures are unremarkable. IMPRESSION: No active cardiopulmonary disease. Electronically Signed   By: Suzen Dials M.D.   On: 09/22/2023 18:40   CT Head Wo Contrast Result Date: 09/22/2023 CLINICAL DATA:  Headache and central chest pain. EXAM: CT HEAD WITHOUT CONTRAST TECHNIQUE: Contiguous axial images were obtained from the base of the skull through the vertex without intravenous contrast. RADIATION DOSE REDUCTION: This exam was performed according to the departmental dose-optimization program which includes automated exposure control, adjustment of the mA and/or kV according to patient size and/or use of iterative reconstruction technique. COMPARISON:  August 12, 2023 FINDINGS: Brain: There is generalized cerebral atrophy with widening of the extra-axial spaces and ventricular dilatation. There are areas of decreased attenuation within the white matter tracts of the supratentorial brain, consistent with microvascular disease changes. Areas of chronic left frontal lobe, left parietal lobe and left occipital lobe encephalomalacia are noted. Vascular: No hyperdense vessel or unexpected calcification. Skull: Normal. Negative for fracture or focal lesion. Sinuses/Orbits: No acute finding. Other: None. IMPRESSION: 1. Generalized cerebral atrophy with chronic white matter small vessel ischemic changes. 2. No acute intracranial abnormality. Electronically Signed   By: Suzen Dials M.D.   On: 09/22/2023 18:39       Assessment & Plan:  Fever, unspecified fever cause -     POCT Influenza A/B -     POC COVID-19 BinaxNow  Body aches -     POCT Influenza A/B -     POC  COVID-19 BinaxNow  Sore throat -     POCT rapid strep A  Gastroesophageal reflux disease, unspecified whether esophagitis present Assessment & Plan: No upper symptoms. Controlled on protonix .    Hypercholesteremia Assessment & Plan: Continue lipitor.  Low cholesterol diet and exercise. Follow lipid panel.    Hypertension, unspecified type Assessment & Plan: Taking amlodipine . Intolerant to 320mg  diovan . Saw nephrology 12/01/23 - recommended sleep study. Recommended limiting sodium intake. Recommended changing to benica/hydrochlorothiazide. Just started this medication. Tolerating. Blood pressure is coming down. Given she just started, hold on making any changes today. Follow pressures. Follow metabolic panel.    Acute viral syndrome Assessment & Plan: With sore throat and chills as outlined. Covid, flu and rapid strep negative. Treat symptoms. Tylenol as directed. Robitussin. Rest. Fluids. Call with update.       Allena Hamilton, MD

## 2023-12-12 NOTE — Patient Instructions (Signed)
 Extra strength tylenol 2 tablets tid.   Robitussin - if needed for congestion.   Rest. Fluids.

## 2023-12-13 ENCOUNTER — Encounter: Payer: Self-pay | Admitting: Internal Medicine

## 2023-12-13 ENCOUNTER — Ambulatory Visit: Payer: Self-pay | Admitting: Internal Medicine

## 2023-12-18 ENCOUNTER — Encounter: Payer: Self-pay | Admitting: Internal Medicine

## 2023-12-18 DIAGNOSIS — B349 Viral infection, unspecified: Secondary | ICD-10-CM | POA: Insufficient documentation

## 2023-12-18 NOTE — Assessment & Plan Note (Signed)
Continue lipitor.  Low cholesterol diet and exercise.  Follow lipid panel.

## 2023-12-18 NOTE — Assessment & Plan Note (Signed)
 Taking amlodipine . Intolerant to 320mg  diovan . Saw nephrology 12/01/23 - recommended sleep study. Recommended limiting sodium intake. Recommended changing to benica/hydrochlorothiazide. Just started this medication. Tolerating. Blood pressure is coming down. Given she just started, hold on making any changes today. Follow pressures. Follow metabolic panel.

## 2023-12-18 NOTE — Assessment & Plan Note (Signed)
No upper symptoms.  Controlled on protonix.  

## 2023-12-18 NOTE — Assessment & Plan Note (Signed)
 With sore throat and chills as outlined. Covid, flu and rapid strep negative. Treat symptoms. Tylenol as directed. Robitussin. Rest. Fluids. Call with update.

## 2024-01-06 ENCOUNTER — Other Ambulatory Visit: Payer: Self-pay | Admitting: Internal Medicine

## 2024-02-02 ENCOUNTER — Ambulatory Visit: Admitting: Internal Medicine

## 2024-02-02 ENCOUNTER — Encounter: Payer: Self-pay | Admitting: Internal Medicine

## 2024-02-02 ENCOUNTER — Other Ambulatory Visit: Payer: Self-pay | Admitting: Internal Medicine

## 2024-02-02 VITALS — BP 121/71 | HR 81 | Resp 16 | Ht 63.0 in | Wt 174.2 lb

## 2024-02-02 DIAGNOSIS — Z124 Encounter for screening for malignant neoplasm of cervix: Secondary | ICD-10-CM

## 2024-02-02 DIAGNOSIS — M545 Low back pain, unspecified: Secondary | ICD-10-CM | POA: Diagnosis not present

## 2024-02-02 DIAGNOSIS — I10 Essential (primary) hypertension: Secondary | ICD-10-CM

## 2024-02-02 DIAGNOSIS — K219 Gastro-esophageal reflux disease without esophagitis: Secondary | ICD-10-CM

## 2024-02-02 DIAGNOSIS — D649 Anemia, unspecified: Secondary | ICD-10-CM | POA: Diagnosis not present

## 2024-02-02 DIAGNOSIS — Z0001 Encounter for general adult medical examination with abnormal findings: Secondary | ICD-10-CM

## 2024-02-02 DIAGNOSIS — R9389 Abnormal findings on diagnostic imaging of other specified body structures: Secondary | ICD-10-CM

## 2024-02-02 DIAGNOSIS — Z1211 Encounter for screening for malignant neoplasm of colon: Secondary | ICD-10-CM

## 2024-02-02 DIAGNOSIS — Z0000 Encounter for general adult medical examination without abnormal findings: Secondary | ICD-10-CM

## 2024-02-02 DIAGNOSIS — E7800 Pure hypercholesterolemia, unspecified: Secondary | ICD-10-CM

## 2024-02-02 DIAGNOSIS — Z23 Encounter for immunization: Secondary | ICD-10-CM

## 2024-02-02 DIAGNOSIS — Z8742 Personal history of other diseases of the female genital tract: Secondary | ICD-10-CM

## 2024-02-02 NOTE — Progress Notes (Signed)
 Subjective:    Patient ID: Sydney Martinez, female    DOB: 1960-09-24, 63 y.o.   MRN: [id number]  Patient here for  Chief Complaint  Patient presents with   Annual Exam    HPI Here for a scheduled follow up - follow up regarding elevated blood pressure and increased stress. Mother recently passed. Have been adjusting her blood pressure medication. Did not tolerate increased dose of diovan . Saw nephrology. Recommended sleep study and changed medication to benicar /hydrochlorothiazide. Has done well on this medication. Blood pressure is doing better overall. She stays active. No chest pain or sob reported. No cough or congestion. She does report low back and hip pain. Persistent. Discussed PT. Also issues with plantar fasciitis.    Past Medical History:  Diagnosis Date   Arthritis    Asthma    GERD (gastroesophageal reflux disease)    Hyperlipidemia    Hypertension    Past Surgical History:  Procedure Laterality Date   APPENDECTOMY     Family History  Problem Relation Age of Onset   Hyperlipidemia Mother    Arthritis Mother    Hyperlipidemia Father    Hyperlipidemia Sister    Breast cancer Neg Hx    Social History   Socioeconomic History   Marital status: Married    Spouse name: Not on file   Number of children: Not on file   Years of education: Not on file   Highest education level: Not on file  Occupational History   Not on file  Tobacco Use   Smoking status: Never   Smokeless tobacco: Never  Vaping Use   Vaping status: Never Used  Substance and Sexual Activity   Alcohol use: No    Alcohol/week: 0.0 standard drinks of alcohol   Drug use: No   Sexual activity: Yes  Other Topics Concern   Not on file  Social History Narrative   Not on file   Social Drivers of Health   Financial Resource Strain: Not on file  Food Insecurity: Not on file  Transportation Needs: Not on file  Physical Activity: Not on file  Stress: Not on file  Social Connections: Not on  file     Review of Systems  Constitutional:  Negative for appetite change and unexpected weight change.  HENT:  Negative for congestion and sinus pressure.   Respiratory:  Negative for cough, chest tightness and shortness of breath.   Cardiovascular:  Negative for chest pain, palpitations and leg swelling.  Gastrointestinal:  Negative for abdominal pain, diarrhea, nausea and vomiting.  Genitourinary:  Negative for difficulty urinating and dysuria.  Musculoskeletal:  Negative for joint swelling.       Low back and hip pain.   Skin:  Negative for color change and rash.  Neurological:  Negative for dizziness and headaches.  Psychiatric/Behavioral:  Negative for agitation and dysphoric mood.        Objective:     BP 121/71   Pulse 81   Resp 16   Ht 5' 3 (1.6 m)   Wt 174 lb 3.2 oz (79 kg)   LMP  (LMP Unknown)   SpO2 98%   BMI 30.86 kg/m  Wt Readings from Last 3 Encounters:  02/02/24 174 lb 3.2 oz (79 kg)  12/12/23 169 lb (76.7 kg)  11/07/23 169 lb 9.6 oz (76.9 kg)    Physical Exam Vitals reviewed.  Constitutional:      General: She is not in acute distress.    Appearance: Normal appearance. She  is well-developed.  HENT:     Head: Normocephalic and atraumatic.     Right Ear: External ear normal.     Left Ear: External ear normal.     Mouth/Throat:     Pharynx: No oropharyngeal exudate or posterior oropharyngeal erythema.  Eyes:     General: No scleral icterus.       Right eye: No discharge.        Left eye: No discharge.     Conjunctiva/sclera: Conjunctivae normal.  Neck:     Thyroid : No thyromegaly.  Cardiovascular:     Rate and Rhythm: Normal rate and regular rhythm.  Pulmonary:     Effort: No tachypnea, accessory muscle usage or respiratory distress.     Breath sounds: Normal breath sounds. No decreased breath sounds or wheezing.  Chest:  Breasts:    Right: No inverted nipple, mass, nipple discharge or tenderness (no axillary adenopathy).     Left: No  inverted nipple, mass, nipple discharge or tenderness (no axilarry adenopathy).  Abdominal:     General: Bowel sounds are normal.     Palpations: Abdomen is soft.     Tenderness: There is no abdominal tenderness.  Musculoskeletal:        General: No swelling or tenderness.     Cervical back: Neck supple. No tenderness.     Comments: Negative SLR.   Lymphadenopathy:     Cervical: No cervical adenopathy.  Skin:    Findings: No erythema or rash.  Neurological:     Mental Status: She is alert and oriented to person, place, and time.  Psychiatric:        Mood and Affect: Mood normal.        Behavior: Behavior normal.         Outpatient Encounter Medications as of 02/02/2024  Medication Sig   amLODipine  (NORVASC ) 10 MG tablet TAKE 1 TABLET (10 MG TOTAL) BY MOUTH DAILY. TAKE IN THE EVENING.   atorvastatin  (LIPITOR) 40 MG tablet Take 1 tablet (40 mg total) by mouth daily.   cetirizine (ZYRTEC) 10 MG tablet cetirizine 10 mg tablet  TAKE 1 TABLET BY MOUTH EVERY DAY   fluticasone  (FLONASE ) 50 MCG/ACT nasal spray Place 1 spray into both nostrils daily.   olmesartan -hydrochlorothiazide (BENICAR  HCT) 40-12.5 MG tablet Take 1 tablet by mouth daily.   pantoprazole  (PROTONIX ) 40 MG tablet Take 1 tablet (40 mg total) by mouth daily.   No facility-administered encounter medications on file as of 02/02/2024.     Lab Results  Component Value Date   WBC 7.8 09/22/2023   HGB 12.7 09/22/2023   HCT 39.1 09/22/2023   PLT 252 09/22/2023   GLUCOSE 98 09/22/2023   CHOL 201 (H) 09/07/2023   TRIG 201.0 (H) 09/07/2023   HDL 31.10 (L) 09/07/2023   LDLDIRECT 175.0 09/23/2022   LDLCALC 129 (H) 09/07/2023   ALT 39 (H) 09/07/2023   AST 42 (H) 09/07/2023   NA 134 (L) 09/22/2023   K 3.7 09/22/2023   CL 103 09/22/2023   CREATININE 0.85 09/22/2023   BUN 15 09/22/2023   CO2 23 09/22/2023   TSH 2.66 09/07/2023    DG Chest 2 View Result Date: 09/22/2023 CLINICAL DATA:  Chest pain. EXAM: CHEST - 2 VIEW  COMPARISON:  None Available. FINDINGS: The heart size and mediastinal contours are within normal limits. Mild linear atelectasis is seen within the left lung base. There is no evidence of acute infiltrate, pleural effusion or pneumothorax. The visualized skeletal structures are unremarkable.  IMPRESSION: No active cardiopulmonary disease. Electronically Signed   By: Suzen Dials M.D.   On: 09/22/2023 18:40   CT Head Wo Contrast Result Date: 09/22/2023 CLINICAL DATA:  Headache and central chest pain. EXAM: CT HEAD WITHOUT CONTRAST TECHNIQUE: Contiguous axial images were obtained from the base of the skull through the vertex without intravenous contrast. RADIATION DOSE REDUCTION: This exam was performed according to the departmental dose-optimization program which includes automated exposure control, adjustment of the mA and/or kV according to patient size and/or use of iterative reconstruction technique. COMPARISON:  August 12, 2023 FINDINGS: Brain: There is generalized cerebral atrophy with widening of the extra-axial spaces and ventricular dilatation. There are areas of decreased attenuation within the white matter tracts of the supratentorial brain, consistent with microvascular disease changes. Areas of chronic left frontal lobe, left parietal lobe and left occipital lobe encephalomalacia are noted. Vascular: No hyperdense vessel or unexpected calcification. Skull: Normal. Negative for fracture or focal lesion. Sinuses/Orbits: No acute finding. Other: None. IMPRESSION: 1. Generalized cerebral atrophy with chronic white matter small vessel ischemic changes. 2. No acute intracranial abnormality. Electronically Signed   By: Suzen Dials M.D.   On: 09/22/2023 18:39       Assessment & Plan:  Routine general medical examination at a health care facility  Immunization due -     Flu vaccine trivalent PF, 6mos and older(Flulaval,Afluria,Fluarix,Fluzone)  Anemia, unspecified type Assessment &  Plan: Hgb 12/01/23 wnl.    Cervical cancer screening Assessment & Plan: Saw gyn recently.  Review. Will need pap if not performed.    Colon cancer screening Assessment & Plan: Colonoscopy 10/2020 - one 4mm polyp in descending colon.  Internal hemorrhoids.  Diverticulosis.  Pathology - polypoid colonic mucosa - negative.    Gastroesophageal reflux disease, unspecified whether esophagitis present Assessment & Plan: No upper symptoms. Controlled on protonix .    Health care maintenance Assessment & Plan: Physical today 02/02/24..  Colonoscopy 10/2020 - one 4mm polyp in descending colon.  Internal hemorrhoids.  Diverticulosis.  Pathology - polypoid colonic mucosa - negative.  Mammogram 08/30/23 - recommended f/u left breast mammogram - 09/01/23 - Birads I. Recommended f/u mammogram in 08/2024.    Thickened endometrium Assessment & Plan: Pelvic ultrasound 10/29/22 - Multiple fibroids are identified. The largest measures 2.6 cm. The endometrial stripe is thickened at 12 mm which is abnormally thickened for age. Recommend gynecologic consultation and endometrial sampling. No other abnormalities.was referred to GYN. Appt with gyn 08/10/23 - EMB. Biopsy negative.    Hypertension, unspecified type Assessment & Plan: Taking amlodipine . Intolerant to 320mg  diovan . Saw nephrology 12/01/23 - recommended sleep study. Recommended limiting sodium intake. Recommended changing to benica/hydrochlorothiazide. Blood pressure improved as outlined. Continue current medication regimen. Follow pressures. Follow metabolic panel.    Hypercholesteremia Assessment & Plan: Continue lipitor.  Low cholesterol diet and exercise. Follow lipid panel.    History of ovarian cyst Assessment & Plan: Documented history of ovarian cyst. Pelvic ultrasound 10/29/22 - Multiple fibroids are identified. The largest measures 2.6 cm. The endometrial stripe is thickened at 12 mm which is abnormally thickened for age. Recommend gynecologic  consultation and endometrial sampling. No other abnormalities.was referred to GYN. Appt with gyn 08/10/23 - EMB. Biopsy negative.    Low back pain, unspecified back pain laterality, unspecified chronicity, unspecified whether sciatica present Assessment & Plan: Low back pain with hip pain. Persistent. Discussed referral to PT. Agreeable   Orders: -     Ambulatory referral to Physical Therapy  Allena Hamilton, MD

## 2024-02-09 ENCOUNTER — Other Ambulatory Visit

## 2024-02-12 ENCOUNTER — Encounter: Payer: Self-pay | Admitting: Internal Medicine

## 2024-02-12 DIAGNOSIS — M545 Low back pain, unspecified: Secondary | ICD-10-CM | POA: Insufficient documentation

## 2024-02-12 NOTE — Assessment & Plan Note (Signed)
Continue lipitor.  Low cholesterol diet and exercise.  Follow lipid panel.

## 2024-02-12 NOTE — Assessment & Plan Note (Signed)
Colonoscopy 10/2020 - one 4mm polyp in descending colon.  Internal hemorrhoids.  Diverticulosis.  Pathology - polypoid colonic mucosa - negative.  

## 2024-02-12 NOTE — Assessment & Plan Note (Signed)
 Low back pain with hip pain. Persistent. Discussed referral to PT. Agreeable

## 2024-02-12 NOTE — Assessment & Plan Note (Signed)
 Physical today 02/02/24..  Colonoscopy 10/2020 - one 4mm polyp in descending colon.  Internal hemorrhoids.  Diverticulosis.  Pathology - polypoid colonic mucosa - negative.  Mammogram 08/30/23 - recommended f/u left breast mammogram - 09/01/23 - Birads I. Recommended f/u mammogram in 08/2024.

## 2024-02-12 NOTE — Assessment & Plan Note (Addendum)
 Saw gyn recently.  Review. Will need pap if not performed.

## 2024-02-12 NOTE — Assessment & Plan Note (Signed)
 Taking amlodipine . Intolerant to 320mg  diovan . Saw nephrology 12/01/23 - recommended sleep study. Recommended limiting sodium intake. Recommended changing to benica/hydrochlorothiazide. Blood pressure improved as outlined. Continue current medication regimen. Follow pressures. Follow metabolic panel.

## 2024-02-12 NOTE — Assessment & Plan Note (Signed)
 Documented history of ovarian cyst. Pelvic ultrasound 10/29/22 - Multiple fibroids are identified. The largest measures 2.6 cm. The endometrial stripe is thickened at 12 mm which is abnormally thickened for age. Recommend gynecologic consultation and endometrial sampling. No other abnormalities.was referred to GYN. Appt with gyn 08/10/23 - EMB. Biopsy negative.

## 2024-02-12 NOTE — Assessment & Plan Note (Signed)
No upper symptoms.  Controlled on protonix.  

## 2024-02-12 NOTE — Assessment & Plan Note (Signed)
 Pelvic ultrasound 10/29/22 - Multiple fibroids are identified. The largest measures 2.6 cm. The endometrial stripe is thickened at 12 mm which is abnormally thickened for age. Recommend gynecologic consultation and endometrial sampling. No other abnormalities.was referred to GYN. Appt with gyn 08/10/23 - EMB. Biopsy negative.

## 2024-02-12 NOTE — Assessment & Plan Note (Signed)
 Hgb 12/01/23 wnl.

## 2024-02-28 ENCOUNTER — Telehealth: Payer: Self-pay

## 2024-02-28 NOTE — Telephone Encounter (Signed)
 Copied from CRM #[id number]. Topic: General - Other >> Feb 28, 2024 10:26 AM Corin V wrote: Reason for CRM: Patient is needing a letter stating she has had her flu shot to give to her employer. She would like to pick it up today. Please call back at [phone] once ready for pickup

## 2024-02-28 NOTE — Telephone Encounter (Signed)
 LETTER PRINTED AND PLACED UP FRONT FOR PICK UP.  PT IS AWARE.

## 2024-03-15 ENCOUNTER — Ambulatory Visit
Admission: RE | Admit: 2024-03-15 | Discharge: 2024-03-15 | Disposition: A | Discharge status: Home / Self Care | Attending: Emergency Medicine | Admitting: Emergency Medicine

## 2024-03-15 VITALS — BP 151/90 | HR 68 | Temp 98.5°F | Resp 18

## 2024-03-15 DIAGNOSIS — B349 Viral infection, unspecified: Secondary | ICD-10-CM | POA: Diagnosis not present

## 2024-03-15 LAB — POC COVID19/FLU A&B COMBO
Covid Antigen, POC: NEGATIVE
Influenza A Antigen, POC: NEGATIVE
Influenza B Antigen, POC: NEGATIVE

## 2024-03-15 NOTE — ED Provider Notes (Signed)
 [facility]    CSN: [id number] Arrival date & time: 03/15/24  1344      History   Chief Complaint Chief Complaint  Patient presents with   Sore Throat    Cough nose running  headache going down into my teeth - Entered by patient    HPI Sydney Martinez is a 63 y.o. female.  Patient presents with 3-day history of bodyaches, chills, runny nose, sore throat, cough.  No OTC medications taken today; took Tylenol last night.  No fever, shortness of breath, vomiting, diarrhea.  Negative COVID test today.  The history is provided by the patient and medical records.    Past Medical History:  Diagnosis Date   Arthritis    Asthma    GERD (gastroesophageal reflux disease)    Hyperlipidemia    Hypertension     Patient Active Problem List   Diagnosis Date Noted   Low back pain 02/12/2024   Acute viral syndrome 12/18/2023   Decreased GFR 09/13/2023   Weight loss counseling, encounter for 09/13/2023   Fibroids 08/10/2023   Thickened endometrium 06/07/2023   Anemia 11/17/2022   Snoring 11/16/2022   Headache 11/16/2022   Health care maintenance 11/15/2022   Left knee pain 10/19/2022   Colon cancer screening 09/29/2022   Hemorrhoid 09/26/2022   Cervical cancer screening 09/26/2022   History of ovarian cyst 09/26/2022   Hypertension 09/23/2022   Hypercholesteremia 09/23/2022   History of rectal bleeding 09/23/2022   GERD (gastroesophageal reflux disease) 09/23/2022   Trigger finger of left hand 11/09/2016    Past Surgical History:  Procedure Laterality Date   APPENDECTOMY      OB History     Gravida  3   Para  3   Term  3   Preterm      AB      Living  3      SAB      IAB      Ectopic      Multiple      Live Births               Home Medications    Prior to Admission medications   Medication Sig Start Date End Date Taking? Authorizing Provider  amLODipine  (NORVASC ) 10 MG tablet TAKE 1 TABLET (10 MG TOTAL) BY MOUTH DAILY. TAKE IN  THE EVENING. 01/06/24   Glendia Shad, MD  atorvastatin  (LIPITOR) 40 MG tablet Take 1 tablet (40 mg total) by mouth daily. 09/13/23   Glendia Shad, MD  cetirizine (ZYRTEC) 10 MG tablet cetirizine 10 mg tablet  TAKE 1 TABLET BY MOUTH EVERY DAY    [provider]  fluticasone  (FLONASE ) 50 MCG/ACT nasal spray Place 1 spray into both nostrils daily. 04/29/23   LampteyAleene KIDD, MD  pantoprazole  (PROTONIX ) 40 MG tablet Take 1 tablet (40 mg total) by mouth daily. 08/16/23   Glendia Shad, MD    Family History Family History  Problem Relation Age of Onset   Hyperlipidemia Mother    Arthritis Mother    Hyperlipidemia Father    Hyperlipidemia Sister    Breast cancer Neg Hx     Social History Social History   Tobacco Use   Smoking status: Never   Smokeless tobacco: Never  Vaping Use   Vaping status: Never Used  Substance Use Topics   Alcohol use: No    Alcohol/week: 0.0 standard drinks of alcohol   Drug use: No     Allergies   Patient has  no known allergies.   Review of Systems Review of Systems  Constitutional:  Positive for chills. Negative for fever.  HENT:  Positive for rhinorrhea and sore throat. Negative for ear pain.   Respiratory:  Positive for cough. Negative for shortness of breath.   Gastrointestinal:  Negative for diarrhea and vomiting.     Physical Exam Triage Vital Signs ED Triage Vitals  Encounter Vitals Group     BP 03/15/24 1408 (!) 151/90     Girls Systolic BP Percentile --      Girls Diastolic BP Percentile --      Boys Systolic BP Percentile --      Boys Diastolic BP Percentile --      Pulse Rate 03/15/24 1408 68     Resp 03/15/24 1408 18     Temp 03/15/24 1408 98.5 F (36.9 C)     Temp src --      SpO2 03/15/24 1408 99 %     Weight --      Height --      Head Circumference --      Peak Flow --      Pain Score 03/15/24 1403 8     Pain Loc --      Pain Education --      Exclude from Growth Chart --    No data  found.  Updated Vital Signs BP (!) 151/90   Pulse 68   Temp 98.5 F (36.9 C)   Resp 18   LMP  (LMP Unknown)   SpO2 99%   Visual Acuity Right Eye Distance:   Left Eye Distance:   Bilateral Distance:    Right Eye Near:   Left Eye Near:    Bilateral Near:     Physical Exam Constitutional:      General: She is not in acute distress. HENT:     Right Ear: Tympanic membrane normal.     Left Ear: Tympanic membrane normal.     Nose: Nose normal.     Mouth/Throat:     Mouth: Mucous membranes are moist.     Pharynx: Oropharynx is clear.  Cardiovascular:     Rate and Rhythm: Normal rate and regular rhythm.     Heart sounds: Normal heart sounds.  Pulmonary:     Effort: Pulmonary effort is normal. No respiratory distress.     Breath sounds: Normal breath sounds.  Neurological:     Mental Status: She is alert.      UC Treatments / Results  Labs (all labs ordered are listed, but only abnormal results are displayed) Labs Reviewed  POC COVID19/FLU A&B COMBO - Normal    EKG   Radiology No results found.  Procedures Procedures (including critical care time)  Medications Ordered in UC Medications - No data to display  Initial Impression / Assessment and Plan / UC Course  I have reviewed the triage vital signs and the nursing notes.  Pertinent labs & imaging results that were available during my care of the patient were reviewed by me and considered in my medical decision making (see chart for details).    Viral illness.  Rapid COVID and flu negative.  Discussed symptomatic treatment including Tylenol or ibuprofen as needed for fever or discomfort, plain Mucinex as needed for congestion, rest, hydration.  Instructed patient to follow-up with her PCP if not improving.  ED precautions given.  Patient agrees to plan of care.  Final Clinical Impressions(s) / UC Diagnoses   Final diagnoses:  Viral illness     Discharge Instructions      The COVID and flu tests are  negative.   Take Tylenol or ibuprofen as needed for fever or discomfort.  Take plain Mucinex as needed for congestion.  Rest and keep yourself hydrated.    Follow-up with your primary care provider if your symptoms are not improving.         ED Prescriptions   None    PDMP not reviewed this encounter.   Corlis Burnard DEL, NP 03/15/24 [phone]

## 2024-03-15 NOTE — Discharge Instructions (Addendum)
 The COVID and flu tests are negative.   Take Tylenol or ibuprofen as needed for fever or discomfort.  Take plain Mucinex as needed for congestion.  Rest and keep yourself hydrated.    Follow-up with your primary care provider if your symptoms are not improving.

## 2024-03-15 NOTE — ED Triage Notes (Signed)
 Patient to Urgent Care with complaints of runny nose/ sore throat/ chills/ body aches. No fevers.  Symptoms x3 days. Covid negative at home today.   Meds: no otc.

## 2024-03-22 ENCOUNTER — Ambulatory Visit
Admission: RE | Admit: 2024-03-22 | Discharge: 2024-03-22 | Disposition: A | Discharge status: Home / Self Care | Source: Ambulatory Visit | Attending: Emergency Medicine | Admitting: Emergency Medicine

## 2024-03-22 VITALS — BP 155/95 | HR 89 | Temp 98.7°F | Resp 18

## 2024-03-22 DIAGNOSIS — J0100 Acute maxillary sinusitis, unspecified: Secondary | ICD-10-CM | POA: Diagnosis not present

## 2024-03-22 MED ORDER — PREDNISONE 10 MG (21) PO TBPK: ORAL_TABLET | Freq: Every day | ORAL | 0 refills | Status: AC

## 2024-03-22 MED ORDER — AMOXICILLIN-POT CLAVULANATE 875-125 MG PO TABS: 1.0000 | ORAL_TABLET | Freq: Two times a day (BID) | ORAL | 0 refills | Status: AC

## 2024-03-22 NOTE — ED Provider Notes (Signed)
 Sydney Martinez    CSN: [id number] Arrival date & time: 03/22/24  1342      History   Chief Complaint Chief Complaint  Patient presents with   Facial Pain    My jaw is real sore - Entered by patient    HPI Sydney Martinez is a 63 y.o. female.   Patient presents for evaluation of persisting nasal congestion and a nonproductive cough present for 10 days.  Experiencing right sided jaw pain present for 7 days, exacerbated by opening closing of the mouth.  Initially experiencing sore throat which has resolved.  Diagnosed with viral infection in this clinic on 03/15/2024.  Has attempted use of Coricidin and Tylenol with minimal relief.  Possible sick contacts that she works at [employer].  Past Medical History:  Diagnosis Date   Arthritis    Asthma    GERD (gastroesophageal reflux disease)    Hyperlipidemia    Hypertension     Patient Active Problem List   Diagnosis Date Noted   Low back pain 02/12/2024   Acute viral syndrome 12/18/2023   Decreased GFR 09/13/2023   Weight loss counseling, encounter for 09/13/2023   Fibroids 08/10/2023   Thickened endometrium 06/07/2023   Anemia 11/17/2022   Snoring 11/16/2022   Headache 11/16/2022   Health care maintenance 11/15/2022   Left knee pain 10/19/2022   Colon cancer screening 09/29/2022   Hemorrhoid 09/26/2022   Cervical cancer screening 09/26/2022   History of ovarian cyst 09/26/2022   Hypertension 09/23/2022   Hypercholesteremia 09/23/2022   History of rectal bleeding 09/23/2022   GERD (gastroesophageal reflux disease) 09/23/2022   Trigger finger of left hand 11/09/2016    Past Surgical History:  Procedure Laterality Date   APPENDECTOMY      OB History     Gravida  3   Para  3   Term  3   Preterm      AB      Living  3      SAB      IAB      Ectopic      Multiple      Live Births               Home Medications    Prior to Admission medications   Medication Sig  Start Date End Date Taking? Authorizing Provider  amoxicillin -clavulanate (AUGMENTIN) 875-125 MG tablet Take 1 tablet by mouth every 12 (twelve) hours. 03/22/24  Yes Laurali Goddard R, NP  predniSONE (STERAPRED UNI-PAK 21 TAB) 10 MG (21) TBPK tablet Take by mouth daily. Take 6 tabs by mouth daily  for 1 days, then 5 tabs for 1 days, then 4 tabs for 1 days, then 3 tabs for 1 days, 2 tabs for 1 days, then 1 tab by mouth daily for 1 days 03/22/24  Yes Azir Muzyka R, NP  amLODipine  (NORVASC ) 10 MG tablet TAKE 1 TABLET (10 MG TOTAL) BY MOUTH DAILY. TAKE IN THE EVENING. 01/06/24   Glendia Shad, MD  atorvastatin  (LIPITOR) 40 MG tablet Take 1 tablet (40 mg total) by mouth daily. 09/13/23   Glendia Shad, MD  cetirizine (ZYRTEC) 10 MG tablet cetirizine 10 mg tablet  TAKE 1 TABLET BY MOUTH EVERY DAY    [provider]  fluticasone  (FLONASE ) 50 MCG/ACT nasal spray Place 1 spray into both nostrils daily. 04/29/23   LampteyAleene KIDD, MD  pantoprazole  (PROTONIX ) 40 MG tablet Take 1 tablet (40 mg total) by mouth daily. 08/16/23  Glendia Shad, MD    Family History Family History  Problem Relation Age of Onset   Hyperlipidemia Mother    Arthritis Mother    Hyperlipidemia Father    Hyperlipidemia Sister    Breast cancer Neg Hx     Social History Social History   Tobacco Use   Smoking status: Never   Smokeless tobacco: Never  Vaping Use   Vaping status: Never Used  Substance Use Topics   Alcohol use: No    Alcohol/week: 0.0 standard drinks of alcohol   Drug use: No     Allergies   Patient has no known allergies.   Review of Systems Review of Systems   Physical Exam Triage Vital Signs ED Triage Vitals  Encounter Vitals Group     BP 03/22/24 1404 (!) 155/95     Girls Systolic BP Percentile --      Girls Diastolic BP Percentile --      Boys Systolic BP Percentile --      Boys Diastolic BP Percentile --      Pulse Rate 03/22/24 1404 89     Resp 03/22/24 1404 18      Temp 03/22/24 1404 98.7 F (37.1 C)     Temp Source 03/22/24 1404 Oral     SpO2 03/22/24 1404 99 %     Weight --      Height --      Head Circumference --      Peak Flow --      Pain Score 03/22/24 1406 8     Pain Loc --      Pain Education --      Exclude from Growth Chart --    No data found.  Updated Vital Signs BP (!) 155/95 (BP Location: Left Arm)   Pulse 89   Temp 98.7 F (37.1 C) (Oral)   Resp 18   LMP  (LMP Unknown)   SpO2 99%   Visual Acuity Right Eye Distance:   Left Eye Distance:   Bilateral Distance:    Right Eye Near:   Left Eye Near:    Bilateral Near:     Physical Exam Constitutional:      Appearance: Normal appearance.  HENT:     Head: Normocephalic.     Right Ear: Tympanic membrane, ear canal and external ear normal.     Left Ear: Tympanic membrane, ear canal and external ear normal.     Nose: Congestion present.     Right Sinus: Maxillary sinus tenderness present.     Mouth/Throat:     Pharynx: No oropharyngeal exudate or posterior oropharyngeal erythema.  Cardiovascular:     Rate and Rhythm: Normal rate and regular rhythm.     Pulses: Normal pulses.     Heart sounds: Normal heart sounds.  Pulmonary:     Effort: Pulmonary effort is normal.     Breath sounds: Normal breath sounds.  Neurological:     Mental Status: She is alert and oriented to person, place, and time. Mental status is at baseline.      UC Treatments / Results  Labs (all labs ordered are listed, but only abnormal results are displayed) Labs Reviewed - No data to display  EKG   Radiology No results found.  Procedures Procedures (including critical care time)  Medications Ordered in UC Medications - No data to display  Initial Impression / Assessment and Plan / UC Course  I have reviewed the triage vital signs and the nursing notes.  Pertinent labs & imaging results that were available during my care of the patient were reviewed by me and considered in my  medical decision making (see chart for details).  Acute nonrecurrent maxillary sinusitis Patient is in no signs of distress nor toxic appearing.  Vital signs are stable.  Low suspicion for pneumonia, pneumothorax or bronchitis.  Symptoms consistent with a sinusitis, present for at least 10 days at this time we will provide bacterial coverage, prescribed Augmentin as well as prednisone.May use additional over-the-counter medications as needed for supportive care.  May follow-up with urgent care as needed if symptoms persist or worsen.  Note given.   Final Clinical Impressions(s) / UC Diagnoses   Final diagnoses:  Acute non-recurrent maxillary sinusitis     Discharge Instructions      Today you are being treated for a sinus infection  Take Augmentin twice daily for 7 days to clear bacteria contributing to your symptoms  Begin prednisone every morning with food to reduce inflammation and help with pain, may take Tylenol in addition    You can take Tylenol  as needed for fever reduction and pain relief.   For cough: honey 1/2 to 1 teaspoon (you can dilute the honey in water or another fluid).  You can also use guaifenesin and dextromethorphan for cough. You can use a humidifier for chest congestion and cough.  If you don't have a humidifier, you can sit in the bathroom with the hot shower running.      For sore throat: try warm salt water gargles, cepacol lozenges, throat spray, warm tea or water with lemon/honey, popsicles or ice, or OTC cold relief medicine for throat discomfort.   For congestion: take a daily anti-histamine like Zyrtec, Claritin, and a oral decongestant, such as pseudoephedrine.  You can also use Flonase  1-2 sprays in each nostril daily.   It is important to stay hydrated: drink plenty of fluids (water, gatorade/powerade/pedialyte, juices, or teas) to keep your throat moisturized and help further relieve irritation/discomfort.    ED Prescriptions     Medication Sig  Dispense Auth. Provider   amoxicillin -clavulanate (AUGMENTIN) 875-125 MG tablet Take 1 tablet by mouth every 12 (twelve) hours. 14 tablet Trevia Nop R, NP   predniSONE (STERAPRED UNI-PAK 21 TAB) 10 MG (21) TBPK tablet Take by mouth daily. Take 6 tabs by mouth daily  for 1 days, then 5 tabs for 1 days, then 4 tabs for 1 days, then 3 tabs for 1 days, 2 tabs for 1 days, then 1 tab by mouth daily for 1 days 21 tablet Dmari Schubring, Shelba SAUNDERS, NP      PDMP not reviewed this encounter.   Teresa Shelba SAUNDERS, NP 03/22/24 [phone]

## 2024-03-22 NOTE — Discharge Instructions (Signed)
 Today you are being treated for a sinus infection  Take Augmentin twice daily for 7 days to clear bacteria contributing to your symptoms  Begin prednisone every morning with food to reduce inflammation and help with pain, may take Tylenol in addition    You can take Tylenol  as needed for fever reduction and pain relief.   For cough: honey 1/2 to 1 teaspoon (you can dilute the honey in water or another fluid).  You can also use guaifenesin and dextromethorphan for cough. You can use a humidifier for chest congestion and cough.  If you don't have a humidifier, you can sit in the bathroom with the hot shower running.      For sore throat: try warm salt water gargles, cepacol lozenges, throat spray, warm tea or water with lemon/honey, popsicles or ice, or OTC cold relief medicine for throat discomfort.   For congestion: take a daily anti-histamine like Zyrtec, Claritin, and a oral decongestant, such as pseudoephedrine.  You can also use Flonase  1-2 sprays in each nostril daily.   It is important to stay hydrated: drink plenty of fluids (water, gatorade/powerade/pedialyte, juices, or teas) to keep your throat moisturized and help further relieve irritation/discomfort.

## 2024-03-22 NOTE — ED Triage Notes (Signed)
 Patient complains of right side facial pain. Patient states she is unable to ope here mouth wide. Patient also reports runny nose with clear drainage. Rates pain 8/10. Patient took Tylenol last night for symptoms with mild relief.

## 2024-05-08 ENCOUNTER — Ambulatory Visit: Admitting: Internal Medicine

## 2024-05-08 DIAGNOSIS — E7800 Pure hypercholesterolemia, unspecified: Secondary | ICD-10-CM

## 2024-05-08 NOTE — Progress Notes (Unsigned)
 Subjective:    Patient ID: Sydney Martinez, female    DOB: [birth year], 63 y.o.   MRN: [id number]  Patient here for No chief complaint on file.   HPI Here for a scheduled follow up - follow up regarding elevated blood pressure. Increased stress - mother recently passed. Had been having issues with her blood pressure. Did not tolerate increased dose of diovan . Saw nephrology. Recommended sleep study and medication change - to benicar /hydrochlorothiazide. Had f/u with nephrology 03/19/24 - recommended to continue current blood pressure medication with plans to increase hydrochlorothiazide to 25mg  if persistent blood pressure elevation. Reordered sleep study. Last visit, reported low back and hip pain. Discussed PT.   F/u gyn? Needs pap.  Release labs   Past Medical History:  Diagnosis Date   Arthritis    Asthma    GERD (gastroesophageal reflux disease)    Hyperlipidemia    Hypertension    Past Surgical History:  Procedure Laterality Date   APPENDECTOMY     Family History  Problem Relation Age of Onset   Hyperlipidemia Mother    Arthritis Mother    Hyperlipidemia Father    Hyperlipidemia Sister    Breast cancer Neg Hx    Social History   Socioeconomic History   Marital status: Married    Spouse name: Not on file   Number of children: Not on file   Years of education: Not on file   Highest education level: Not on file  Occupational History   Not on file  Tobacco Use   Smoking status: Never   Smokeless tobacco: Never  Vaping Use   Vaping status: Never Used  Substance and Sexual Activity   Alcohol use: No    Alcohol/week: 0.0 standard drinks of alcohol   Drug use: No   Sexual activity: Yes  Other Topics Concern   Not on file  Social History Narrative   Not on file   Social Drivers of Health   Tobacco Use: Low Risk (03/22/2024)   Patient History    Smoking Tobacco Use: Never    Smokeless Tobacco Use: Never    Passive Exposure: Not on file  Financial  Resource Strain: Not on file  Food Insecurity: Not on file  Transportation Needs: Not on file  Physical Activity: Not on file  Stress: Not on file  Social Connections: Not on file  Depression (PHQ2-9): Low Risk (11/07/2023)   Depression (PHQ2-9)    PHQ-2 Score: 3  Alcohol Screen: Not on file  Housing: Not on file  Utilities: Not on file  Health Literacy: Not on file     Review of Systems     Objective:     LMP  (LMP Unknown)  Wt Readings from Last 3 Encounters:  02/02/24 174 lb 3.2 oz (79 kg)  12/12/23 169 lb (76.7 kg)  11/07/23 169 lb 9.6 oz (76.9 kg)    Physical Exam  {Perform Simple Foot Exam  Perform Detailed exam:1} {Insert foot Exam (Optional):30965}   Outpatient Encounter Medications as of 05/08/2024  Medication Sig   amLODipine  (NORVASC ) 10 MG tablet TAKE 1 TABLET (10 MG TOTAL) BY MOUTH DAILY. TAKE IN THE EVENING.   amoxicillin -clavulanate (AUGMENTIN ) 875-125 MG tablet Take 1 tablet by mouth every 12 (twelve) hours.   atorvastatin  (LIPITOR) 40 MG tablet Take 1 tablet (40 mg total) by mouth daily.   cetirizine (ZYRTEC) 10 MG tablet cetirizine 10 mg tablet  TAKE 1 TABLET BY MOUTH EVERY DAY   fluticasone  (FLONASE ) 50 MCG/ACT nasal spray  Place 1 spray into both nostrils daily.   pantoprazole  (PROTONIX ) 40 MG tablet Take 1 tablet (40 mg total) by mouth daily.   predniSONE  (STERAPRED UNI-PAK 21 TAB) 10 MG (21) TBPK tablet Take by mouth daily. Take 6 tabs by mouth daily  for 1 days, then 5 tabs for 1 days, then 4 tabs for 1 days, then 3 tabs for 1 days, 2 tabs for 1 days, then 1 tab by mouth daily for 1 days   No facility-administered encounter medications on file as of 05/08/2024.     Lab Results  Component Value Date   WBC 7.8 09/22/2023   HGB 12.7 09/22/2023   HCT 39.1 09/22/2023   PLT 252 09/22/2023   GLUCOSE 98 09/22/2023   CHOL 201 (H) 09/07/2023   TRIG 201.0 (H) 09/07/2023   HDL 31.10 (L) 09/07/2023   LDLDIRECT 175.0 09/23/2022   LDLCALC 129 (H)  09/07/2023   ALT 39 (H) 09/07/2023   AST 42 (H) 09/07/2023   NA 134 (L) 09/22/2023   K 3.7 09/22/2023   CL 103 09/22/2023   CREATININE 0.85 09/22/2023   BUN 15 09/22/2023   CO2 23 09/22/2023   TSH 2.66 09/07/2023    No results found.     Assessment & Plan:  There are no diagnoses linked to this encounter.   Allena Hamilton, MD

## 2024-05-13 ENCOUNTER — Encounter: Payer: Self-pay | Admitting: Internal Medicine

## 2024-05-13 NOTE — Progress Notes (Signed)
 Patient ID: Sydney Martinez, female   DOB: 01-03-1961, 63 y.o.   MRN: [id number] No show for appt.
